# Patient Record
Sex: Female | Born: 1935 | Race: Black or African American | Hispanic: No | Smoking: Never smoker
Health system: Southern US, Community
[De-identification: ages and names within clinical notes are randomized; demographics above are authoritative.]

## PROBLEM LIST (undated history)

## (undated) DIAGNOSIS — F0280 Dementia in other diseases classified elsewhere without behavioral disturbance: Secondary | ICD-10-CM

## (undated) DIAGNOSIS — H269 Unspecified cataract: Secondary | ICD-10-CM

## (undated) DIAGNOSIS — I10 Essential (primary) hypertension: Secondary | ICD-10-CM

## (undated) DIAGNOSIS — M1990 Unspecified osteoarthritis, unspecified site: Secondary | ICD-10-CM

## (undated) DIAGNOSIS — H409 Unspecified glaucoma: Secondary | ICD-10-CM

## (undated) DIAGNOSIS — G3109 Other frontotemporal dementia: Secondary | ICD-10-CM

## (undated) HISTORY — DX: Essential (primary) hypertension: I10

## (undated) HISTORY — DX: Dementia in other diseases classified elsewhere, unspecified severity, without behavioral disturbance, psychotic disturbance, mood disturbance, and anxiety: F02.80

## (undated) HISTORY — PX: APPENDECTOMY: SHX54

## (undated) HISTORY — DX: Unspecified cataract: H26.9

## (undated) HISTORY — DX: Unspecified glaucoma: H40.9

## (undated) HISTORY — DX: Other frontotemporal dementia: G31.09

## (undated) HISTORY — DX: Unspecified osteoarthritis, unspecified site: M19.90

## (undated) HISTORY — PX: CATARACT EXTRACTION: SUR2

---

## 2005-07-01 ENCOUNTER — Ambulatory Visit: Payer: Self-pay | Admitting: Family Medicine

## 2008-02-07 ENCOUNTER — Other Ambulatory Visit: Payer: Self-pay

## 2008-02-08 ENCOUNTER — Other Ambulatory Visit: Payer: Self-pay

## 2008-02-08 ENCOUNTER — Inpatient Hospital Stay: Payer: Self-pay | Admitting: Internal Medicine

## 2008-02-10 ENCOUNTER — Inpatient Hospital Stay: Payer: Self-pay | Admitting: Internal Medicine

## 2008-03-05 ENCOUNTER — Ambulatory Visit: Payer: Self-pay | Admitting: Family Medicine

## 2008-06-17 ENCOUNTER — Ambulatory Visit: Payer: Self-pay | Admitting: Ophthalmology

## 2008-06-17 ENCOUNTER — Other Ambulatory Visit: Payer: Self-pay

## 2008-07-02 ENCOUNTER — Ambulatory Visit: Payer: Self-pay | Admitting: Ophthalmology

## 2009-03-31 ENCOUNTER — Ambulatory Visit: Payer: Self-pay | Admitting: Family Medicine

## 2009-04-21 ENCOUNTER — Encounter: Payer: Self-pay | Admitting: Internal Medicine

## 2009-04-25 ENCOUNTER — Encounter: Payer: Self-pay | Admitting: Internal Medicine

## 2009-04-26 ENCOUNTER — Emergency Department: Payer: Self-pay | Admitting: Internal Medicine

## 2009-04-28 ENCOUNTER — Emergency Department: Payer: Self-pay | Admitting: Emergency Medicine

## 2009-04-30 ENCOUNTER — Emergency Department: Payer: Self-pay | Admitting: Unknown Physician Specialty

## 2009-05-01 ENCOUNTER — Ambulatory Visit: Payer: Self-pay | Admitting: Vascular Surgery

## 2009-05-21 ENCOUNTER — Ambulatory Visit: Payer: Self-pay | Admitting: Family Medicine

## 2009-05-26 ENCOUNTER — Encounter: Payer: Self-pay | Admitting: Internal Medicine

## 2009-06-10 ENCOUNTER — Encounter: Payer: Self-pay | Admitting: Internal Medicine

## 2009-06-25 ENCOUNTER — Encounter: Payer: Self-pay | Admitting: Internal Medicine

## 2009-07-27 ENCOUNTER — Ambulatory Visit: Payer: Self-pay | Admitting: Internal Medicine

## 2009-08-05 ENCOUNTER — Ambulatory Visit: Payer: Self-pay | Admitting: Internal Medicine

## 2009-08-13 ENCOUNTER — Ambulatory Visit: Payer: Self-pay | Admitting: Internal Medicine

## 2009-08-18 ENCOUNTER — Ambulatory Visit: Payer: Self-pay | Admitting: Family Medicine

## 2009-08-20 ENCOUNTER — Ambulatory Visit: Payer: Self-pay | Admitting: Internal Medicine

## 2009-08-27 ENCOUNTER — Ambulatory Visit: Payer: Self-pay | Admitting: Internal Medicine

## 2009-09-10 ENCOUNTER — Ambulatory Visit: Payer: Self-pay | Admitting: Internal Medicine

## 2009-09-15 ENCOUNTER — Ambulatory Visit: Payer: Self-pay | Admitting: Internal Medicine

## 2009-09-30 ENCOUNTER — Ambulatory Visit: Payer: Self-pay | Admitting: Internal Medicine

## 2010-02-02 ENCOUNTER — Ambulatory Visit: Payer: Self-pay | Admitting: Family Medicine

## 2010-06-24 ENCOUNTER — Emergency Department: Payer: Self-pay | Admitting: Emergency Medicine

## 2011-07-13 ENCOUNTER — Ambulatory Visit: Payer: Self-pay | Admitting: Ophthalmology

## 2011-07-18 ENCOUNTER — Emergency Department: Payer: Self-pay | Admitting: *Deleted

## 2013-03-31 ENCOUNTER — Observation Stay: Payer: Self-pay | Admitting: Internal Medicine

## 2013-03-31 LAB — COMPREHENSIVE METABOLIC PANEL
Albumin: 3.6 g/dL (ref 3.4–5.0)
Alkaline Phosphatase: 108 U/L (ref 50–136)
Anion Gap: 5 — ABNORMAL LOW (ref 7–16)
BUN: 20 mg/dL — ABNORMAL HIGH (ref 7–18)
Calcium, Total: 8.8 mg/dL (ref 8.5–10.1)
Co2: 25 mmol/L (ref 21–32)
Creatinine: 0.69 mg/dL (ref 0.60–1.30)
EGFR (Non-African Amer.): >60
Glucose: 91 mg/dL (ref 65–99)
Osmolality: 283 (ref 275–301)
SGOT(AST): 29 U/L (ref 15–37)
Sodium: 141 mmol/L (ref 136–145)
Total Protein: 6.8 g/dL (ref 6.4–8.2)

## 2013-03-31 LAB — CBC
HCT: 41.9 % (ref 35.0–47.0)
HGB: 13.8 g/dL (ref 12.0–16.0)
MCH: 28.4 pg (ref 26.0–34.0)
MCV: 86 fL (ref 80–100)
Platelet: 185 10*3/uL (ref 150–440)
WBC: 7.8 10*3/uL (ref 3.6–11.0)

## 2013-03-31 LAB — URINALYSIS, COMPLETE
Bilirubin,UR: NEGATIVE
Leukocyte Esterase: NEGATIVE
Nitrite: NEGATIVE
Ph: 6 (ref 4.5–8.0)
Protein: NEGATIVE
RBC,UR: 1 /HPF (ref 0–5)
Specific Gravity: 1.018 (ref 1.003–1.030)
Squamous Epithelial: NONE SEEN
WBC UR: <1 /HPF (ref 0–5)

## 2013-03-31 LAB — TSH: Thyroid Stimulating Horm: 2.29 u[IU]/mL

## 2013-03-31 LAB — TROPONIN I: Troponin-I: 0.04 ng/mL

## 2013-04-01 LAB — BASIC METABOLIC PANEL
Anion Gap: 5 — ABNORMAL LOW (ref 7–16)
BUN: 16 mg/dL (ref 7–18)
Calcium, Total: 8.2 mg/dL — ABNORMAL LOW (ref 8.5–10.1)
Chloride: 111 mmol/L — ABNORMAL HIGH (ref 98–107)
Creatinine: 0.74 mg/dL (ref 0.60–1.30)
EGFR (African American): >60
Potassium: 3.4 mmol/L — ABNORMAL LOW (ref 3.5–5.1)
Sodium: 141 mmol/L (ref 136–145)

## 2013-04-02 ENCOUNTER — Encounter: Payer: Self-pay | Admitting: Internal Medicine

## 2013-04-04 LAB — TSH: Thyroid Stimulating Horm: 3.2 u[IU]/mL

## 2013-09-02 ENCOUNTER — Ambulatory Visit: Payer: Self-pay | Admitting: Family Medicine

## 2014-06-13 IMAGING — CR DG CHEST 2V
1 series · 2 of 2 positions shown · non-contrast
Comparison: none

REASON FOR EXAM: weakness
COMMENTS:

[Series 1: w chest pa · 0.14mm/px · 2 of 2 slices shown]
[im 1/2]
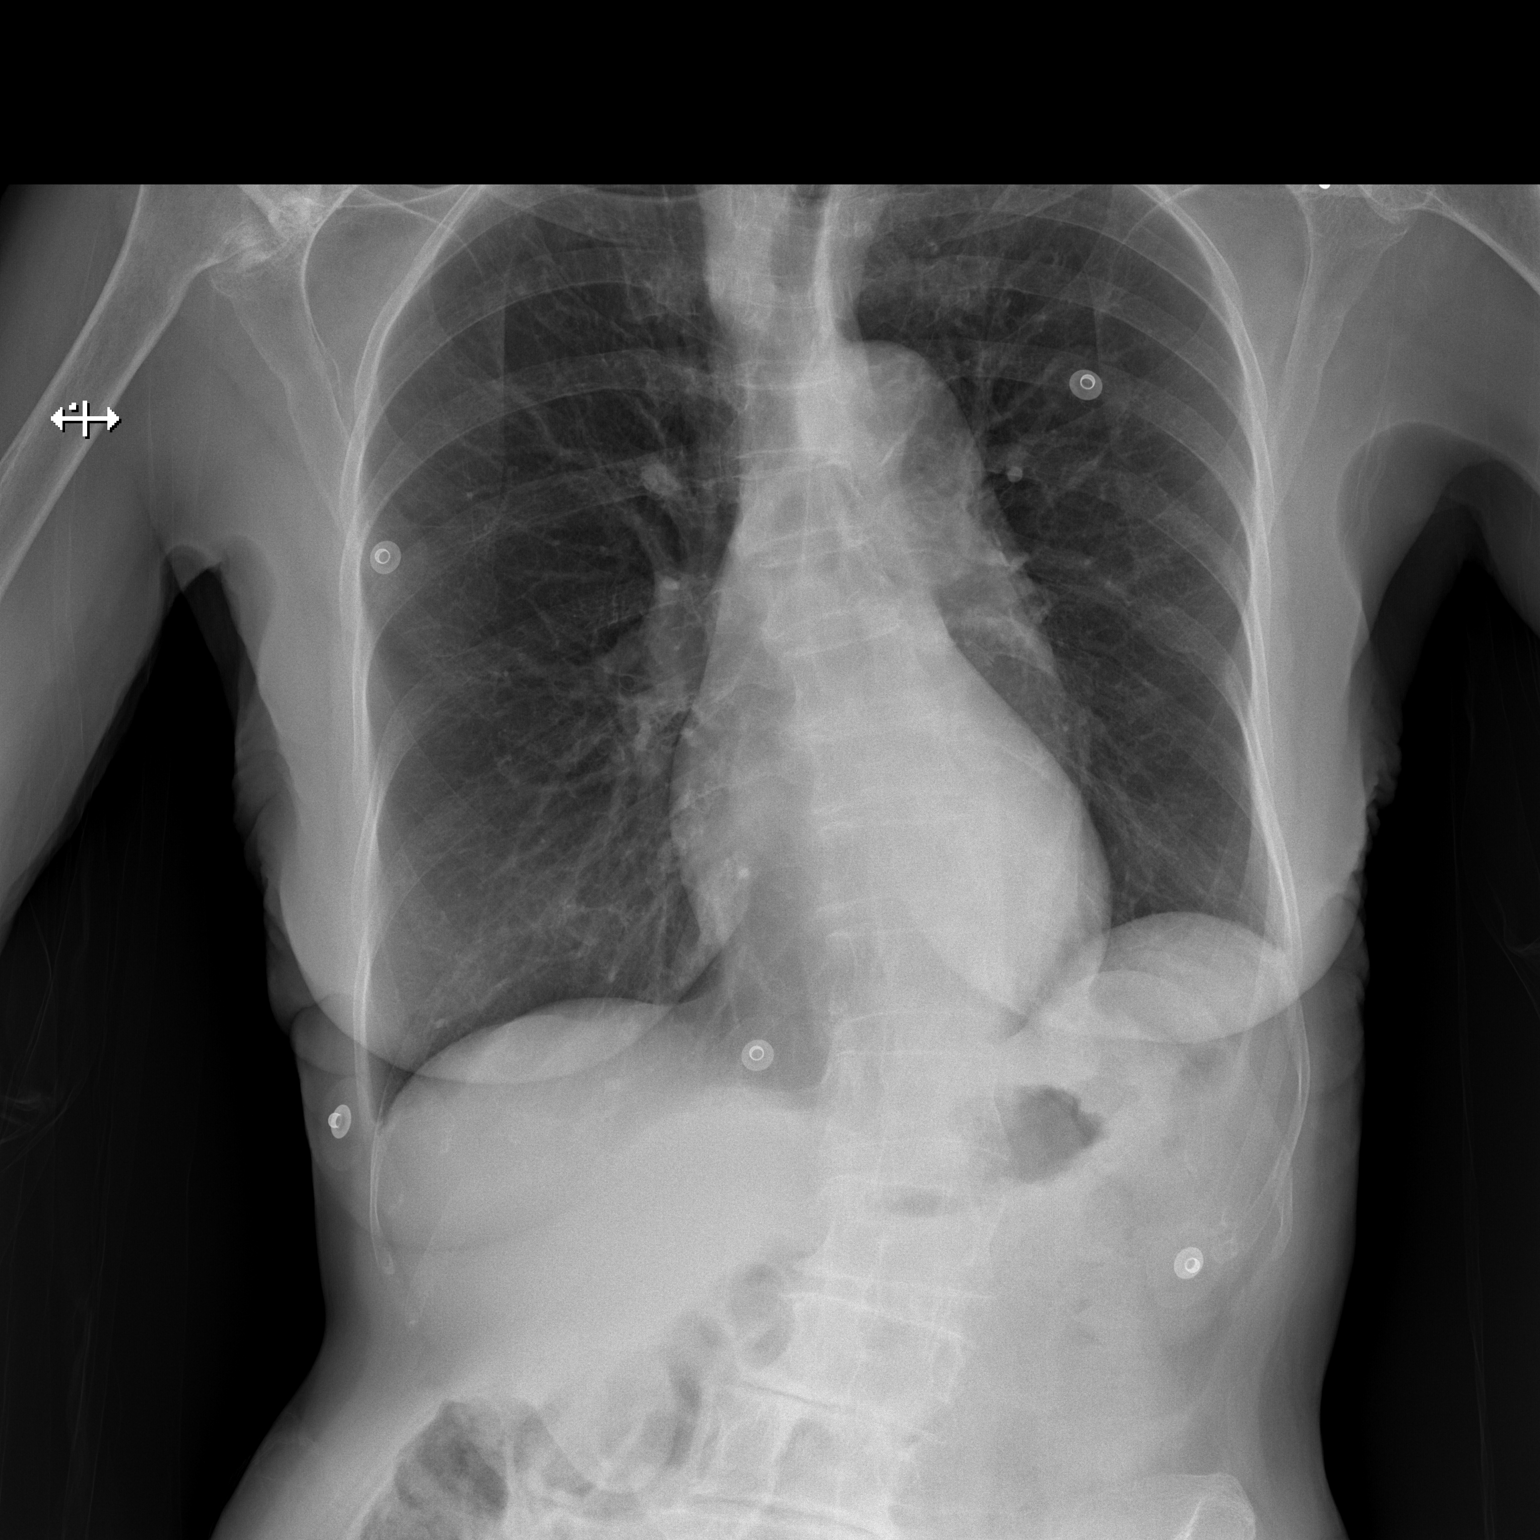
[im 2/2]
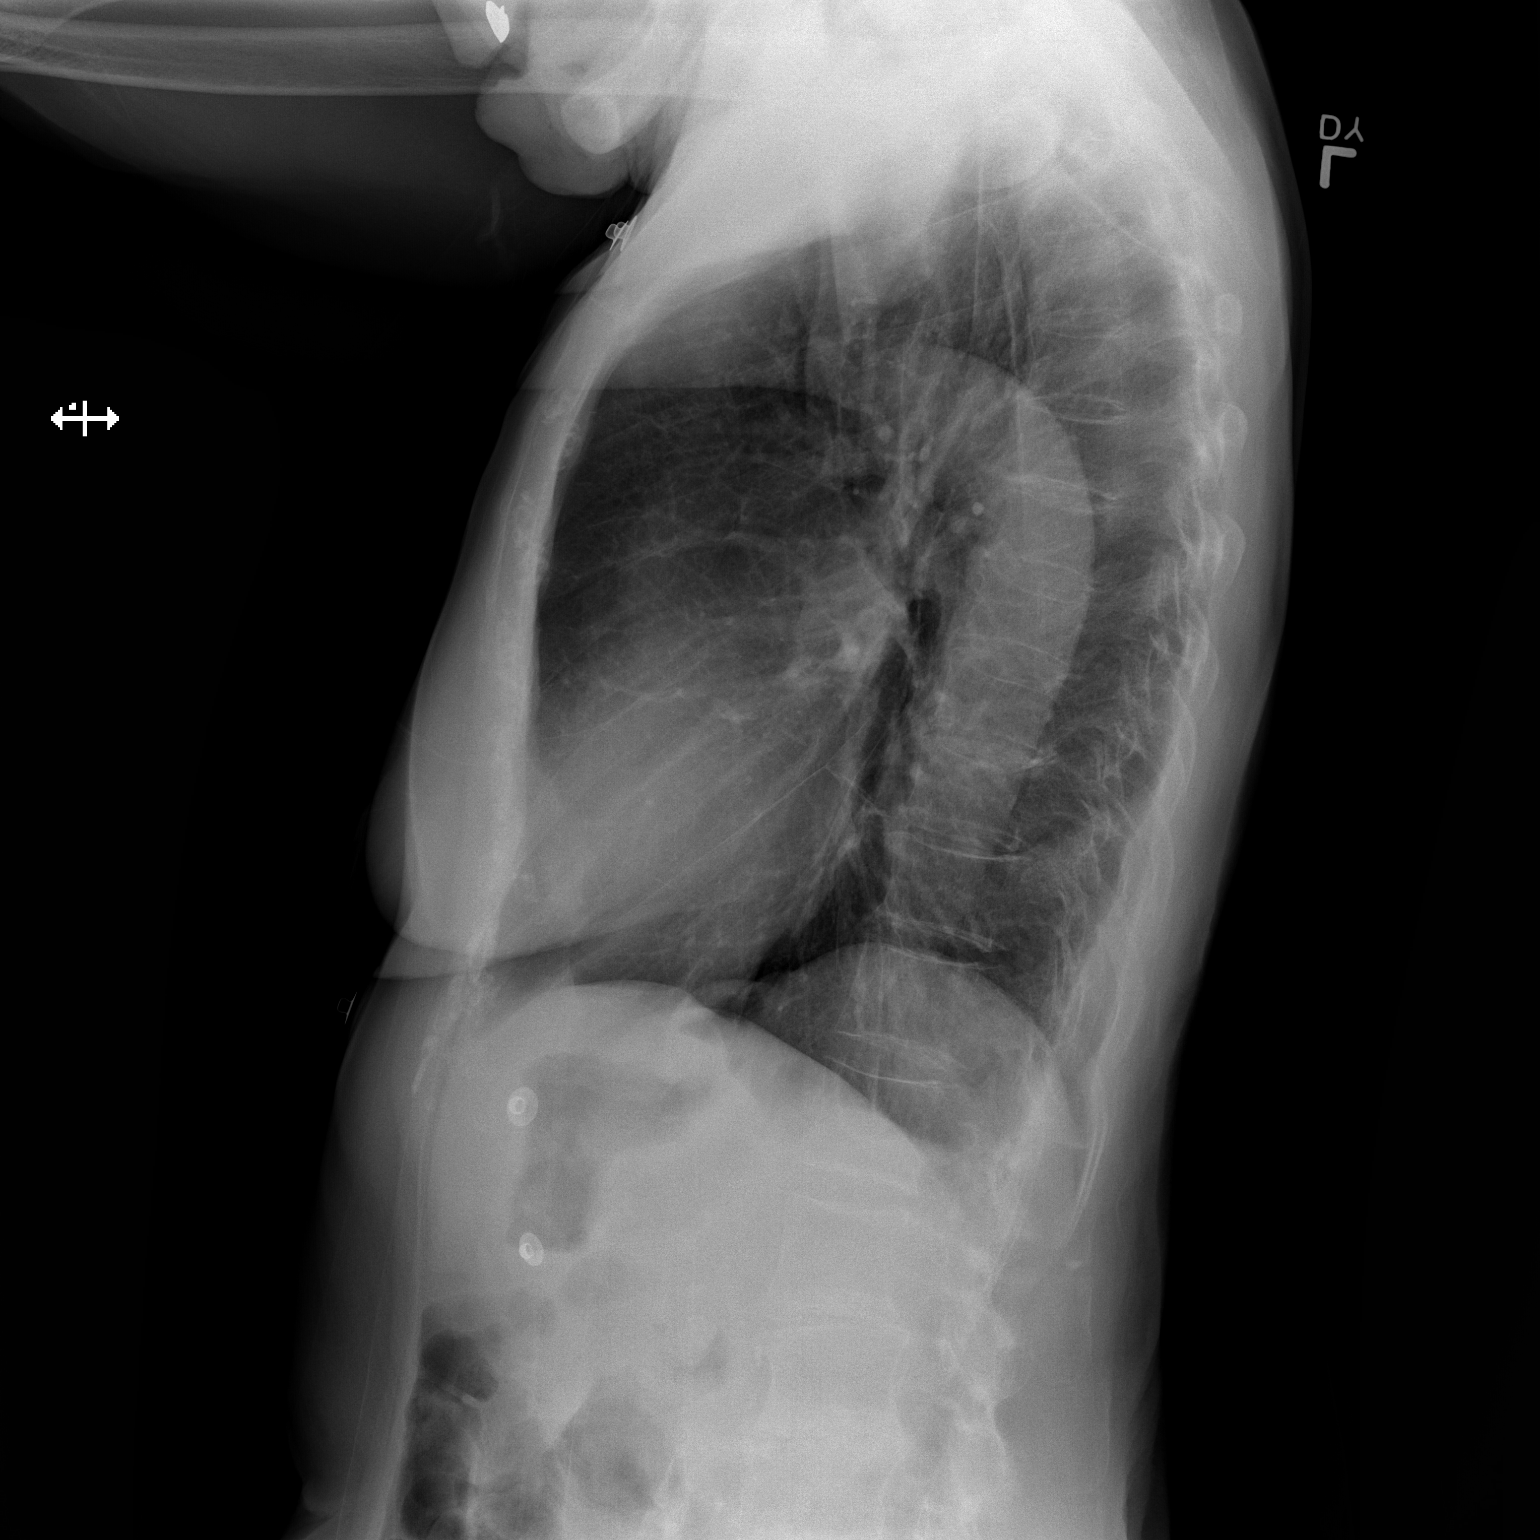

[2 of 2 positions shown; findings below may reference images not displayed]

PROCEDURE:     DXR - DXR CHEST PA (OR AP) AND LATERAL  - March 31, 2013  [DATE]

RESULT:     Comparison made to prior study of 07/18/2011. Lungs clear. Heart
size normal. Diffuse osteopenia and degenerative change thoracic spine with
stable mild compression fractures. Degenerative changes both shoulders.
IMPRESSION: No acute abnormality.

## 2015-04-17 DIAGNOSIS — H4011X3 Primary open-angle glaucoma, severe stage: Secondary | ICD-10-CM | POA: Diagnosis not present

## 2015-04-17 NOTE — Discharge Summary (Signed)
PATIENT NAME:  Monica Vaughn, [place] MR#:  [id number] DATE OF BIRTH:  [birth year]  DATE OF ADMISSION:  03/31/2013 DATE OF DISCHARGE:  04/082014  ADMISSION DIAGNOSIS: Generalized weakness.  DISCHARGE DIAGNOSES: 1.  Generalized weakness.  2.  Hypertension.  3.  History of glaucoma.  4.  Dementia.  5.  Superficial femoral vein deep venous thrombosis.   CONSULTANTS:  Physical therapy.   LABORATORY AND DIAGNOSTICS: At discharge: Sodium 141, potassium 3.4, chloride 111, bicarbonate 25, BUN 16, creatinine 0.74 and glucose 84.  Lower extremity Doppler showed cannot exclude a very mild nonocclusive thrombus in the right superficial femoral vein.   CT of the head showed no acute intracranial hemorrhage.  Chest x-ray showed no acute infiltrate.   HOSPITAL COURSE:  This is a very pleasant 81 year old female who presented with generalized weakness. For further details, please refer to the H and P. 1.  Weakness. The patient was admitted for generalized weakness. It was unclear as to the etiology. Her symptoms actually subsided. She has some mild weakness and PT recommended home health versus rehabilitation and the family wanted the patient to go to rehab so she will be discharged to rehab. 2.  Right lower extremity edema. Her Doppler showed possible nonocclusive DVT.  I discussed the risks, benefits and alternatives with the patient's family and the patient. We decided on Xarelto.  The patient was started on Xarelto 15 mg p.o. b.i.d. x 21 days and Xarelto 20 mg daily after that for 3 months.  2.  Hypertension. On Norvasc.  3.  Glaucoma. The patient will continue her eye drops.  DISCHARGE MEDICATIONS: 1.  Donepezil 10 mg daily.  2.  Combigan 1 drop b.i.d.  3.  Latanoprost 0.005% eyedrops to both eyes at bedtime.  4.  Azopt 1% ophthalmic suspension 1 drop b.i.d.  5.  Tylenol 2 tablets daily at bedtime for pain.  6.  Xarelto 15 mg b.i.d. for 15 days then 20 mg daily for 3 months.  7.  Norvasc 5 mg  daily.  8.  Nexium 40 mg daily.   DISCHARGE DIET: Low sodium. Consistency regular.   DISCHARGE ACTIVITY: As tolerated.  DISCHARGE INSTRUCTIONS:  The will need to discontinue Xarelto on 07/01/2013. She needs a hemoglobin check in 1 week.  TIME SPENT: 35 minutes.  ____________________________ Janyth ContesSital P. Juliene PinaMody, MD spm:sb D: 04/01/2013 15:21:32 ET T: 04/01/2013 15:38:34 ET JOB#: [id number]  cc: Britne Borelli P. Juliene PinaMody, MD, <Dictator> Dennison MascotLemont Morrisey, MD Janyth ContesSITAL P Alyxandra Tenbrink MD ELECTRONICALLY SIGNED 04/02/2013 14:35

## 2015-04-17 NOTE — H&P (Signed)
PATIENT NAME:  Monica Vaughn, Monica MR#:  [id number] DATE OF BIRTH:  May 06, 1917  DATE OF ADMISSION:  03/31/2013  PRIMARY CARE PHYSICIAN:  Dennison MascotLemont Morrisey, MD  CHIEF COMPLAINT: Weakness.   HISTORY OF PRESENT ILLNESS:  The patient is a 81 year old very pleasant female with a history of hypertension, mild dementia and glaucoma who presents with the above complaint. The patient has been in her usual state up until this morning when she tried to get out of bed. She felt very weak and rolled out of her bed and was unable to get up from the floor. She had no neurological deficits including slurred speech or weakness on one side. She was brought here for further evaluation. In the Emergency Room, a urinalysis was done which was negative as well as a CT of the head and chest x-ray. She has no neurological deficits. She does state that recently she has had suffered a cold.   REVIEW OF SYSTEMS:  CONSTITUTIONAL: No fever or fatigue. She has generalized weakness. No weight loss or gain.  EYES: No blurred or double vision. She has glaucoma.  ENT: No ear pain, hearing loss. She does have some seasonal allergies and snores occasionally.  RESPIRATORY: No cough, wheezing, or COPD, dyspnea.  CARDIOVASCULAR: No chest pain, palpitations, orthopnea, syncope, edema, arrhythmia.  GASTROINTESTINAL: No nausea, vomiting, diarrhea, abdominal pain, melena, or ulcers.  GENITOURINARY: No dysuria or hematuria.  ENDOCRINE: No polyuria, polydipsia, thyroid problems, heat or cold intolerance.  HEMATOLOGIC/LYMPHATICS: No anemia or easy bruising.  SKIN: No rash or lesions.  MUSCULOSKELETAL: No pain in the shoulder or knee. She sometimes has hip pain from arthritis. No limited activity, although she does walk occasionally with a cane.  NEUROLOGIC: No CVA, TIA or seizures.  PSYCHIATRIC: No anxiety or depression.   PAST MEDICAL HISTORY:   1.  Hypertension.  2.  Glaucoma.  3.  Dementia.   MEDICATIONS:  We do not have a correct  medication list at this time.   ALLERGIES:  ACE INHIBITOR CAUSES SWELLING.   PAST SURGICAL HISTORY:  Appendectomy.   SOCIAL HISTORY:  No tobacco, alcohol, or drug use. She lives by herself. Her niece checks on her and she does have someone to watch over her during the daytime.   PHYSICAL EXAMINATION:   VITAL SIGNS: Temperature 98.5, pulse 74, respirations 18, blood pressure 167/85, 96% on room air.  GENERAL: The patient is alert, oriented, not in acute distress.  HEENT: Head is atraumatic. Pupils are round and reactive. Sclerae anicteric. Mucous membranes are moist. Oropharynx is clear.  NECK: Supple without JVD, carotid bruit or enlarged thyroid.  CARDIOVASCULAR: Regular rate and rhythm. No murmurs, gallops, or rubs. PMI is not displaced.  LUNGS: Clear to auscultation bilaterally with crackles, rales, rhonchi, or wheezing. Normal percussion.  ABDOMEN: Bowel sounds are positive. Nontender, nondistended. No hepatosplenomegaly.  EXTREMITIES: No clubbing, or edema.  NEUROLOGIC: Cranial nerves II through 12 are intact. There are no focal deficits. Finger-to-nose is intact. Heel-to-shin is intact. Babinski sign is downgoing. Strength is 5+ bilaterally and symmetrically. She has very mild pitting edema bilaterally and symmetrically.  SKIN: Without rashes or lesions.   DIAGNOSTIC DATA:  EKG shows some Q waves in the anterior leads. No acute ST elevations or depressions. White blood cells 7.8, hemoglobin 13.8, hematocrit 42, platelets 185. Sodium 141, potassium 3.3, chloride 111, bicarbonate 25, BUN 20, creatinine 0.69, glucose 91, calcium 8.8, bilirubin 0.6, alkaline phosphatase 108, ALT 18, AST 29, total protein 6.8, albumin 3.6. Urinalysis shows no leukocyte esterase  or nitrites. Troponin is 0.04. CT of the head shows no acute intracranial hemorrhage or CVA. Chest x-ray shows no acute infiltrate.   ASSESSMENT AND PLAN:  A very pleasant 28 female with a history of dementia, hypertension and glaucoma  who presents with weakness.  1.  Generalized weakness. The patient has no neurological deficits to suggest a cerebrovascular accident. It is unclear as to the etiology of her generalized weakness. She has no sort of infection and a normal head CT. For now, we will place her on medical/surgical with telemetry and continue neuro checks q. 4 hours x 24 hours. I will also request physical therapy to assess the patient for gait.  2.  Hypokalemia, very mild. We will go head and replete and we can check potassium in the a.m.  3.  Hypertension. As per her old chart, she was on Norvasc. I have started Norvasc 10 mg and I have asked the nurse to obtain a medication list.  4.  Dementia. The patient was on Aricept in the past so we will continue this for now and obtain a new medication list.  5.  Lower extremity edema. The patient says that this is chronic and old, but will order Dopplers to rule out a deep vein thrombosis.   CODE STATUS:  The patient is FULL CODE STATUS.   TIME SPENT:  Approximately 45 minutes.    ____________________________ Janyth Contes. Juliene Pina, MD spm:si D: 03/31/2013 11:42:00 ET T: 03/31/2013 15:13:58 ET JOB#: [id number]  cc: Takera Rayl P. Juliene Pina, MD, <Dictator> Dennison Mascot, MD Janyth Contes Adelia Baptista MD ELECTRONICALLY SIGNED 03/31/2013 19:57

## 2015-04-17 NOTE — Discharge Summary (Signed)
PATIENT NAME:  [name], [place] MR#:  [id number] DATE OF BIRTH:  04/09/1917  ADDENDUM:  DATE OF ADMISSION:  03/31/2013 DATE OF DISCHARGE:  04/02/2013   The patient was supposed to be discharged on 04/01/2013, however, she actually was waiting for a rehab bed and she was discharged in 04/03/2003. There have been no other changes to her discharge summary.  Please refer to the discharge summary.    ____________________________ Janyth ContesSital P. Juliene PinaMody, MD spm:ct D: 04/02/2013 12:36:00 ET T: 04/02/2013 12:43:04 ET JOB#: [id number]  cc: Zylpha Poynor P. Juliene PinaMody, MD, <Dictator> Janyth ContesSITAL P Early Ord MD ELECTRONICALLY SIGNED 04/02/2013 14:36

## 2015-06-11 ENCOUNTER — Encounter: Payer: Self-pay | Admitting: Emergency Medicine

## 2015-06-15 ENCOUNTER — Ambulatory Visit: Payer: Self-pay | Admitting: Family Medicine

## 2015-07-14 ENCOUNTER — Ambulatory Visit (INDEPENDENT_AMBULATORY_CARE_PROVIDER_SITE_OTHER): Payer: Medicare Other | Admitting: Family Medicine

## 2015-07-14 ENCOUNTER — Encounter: Payer: Self-pay | Admitting: Family Medicine

## 2015-07-14 VITALS — BP 124/86 | HR 84 | Temp 97.6°F | Resp 16 | Ht 64.0 in | Wt 95.1 lb

## 2015-07-14 DIAGNOSIS — R413 Other amnesia: Secondary | ICD-10-CM

## 2015-07-14 DIAGNOSIS — E785 Hyperlipidemia, unspecified: Secondary | ICD-10-CM | POA: Diagnosis not present

## 2015-07-14 DIAGNOSIS — I7090 Unspecified atherosclerosis: Secondary | ICD-10-CM | POA: Insufficient documentation

## 2015-07-14 DIAGNOSIS — I739 Peripheral vascular disease, unspecified: Secondary | ICD-10-CM | POA: Diagnosis not present

## 2015-07-14 DIAGNOSIS — B351 Tinea unguium: Secondary | ICD-10-CM | POA: Insufficient documentation

## 2015-07-14 DIAGNOSIS — E46 Unspecified protein-calorie malnutrition: Secondary | ICD-10-CM

## 2015-07-14 DIAGNOSIS — I10 Essential (primary) hypertension: Secondary | ICD-10-CM | POA: Diagnosis not present

## 2015-07-14 DIAGNOSIS — R636 Underweight: Secondary | ICD-10-CM | POA: Diagnosis not present

## 2015-07-14 MED ORDER — ASPIRIN EC 81 MG PO TBEC: 81.0000 mg | DELAYED_RELEASE_TABLET | Freq: Every day | ORAL | Status: DC

## 2015-07-14 MED ORDER — FLUTICASONE PROPIONATE 50 MCG/ACT NA SUSP: 2.0000 | Freq: Every day | NASAL | Status: DC

## 2015-07-14 NOTE — Patient Instructions (Signed)
Refer to podiatrist 

## 2015-07-14 NOTE — Progress Notes (Signed)
Name: Monica Vaughn   MRN: [id number]    DOB: 1917/01/16   Date:07/14/2015       Progress Note  Subjective  Chief Complaint  Chief Complaint  Patient presents with  . Memory Loss  . Hyperlipidemia  . Hypertension    Hyperlipidemia This is a chronic problem. The current episode started more than 1 year ago. The problem is uncontrolled. Recent lipid tests were reviewed and are high. Pertinent negatives include no chest pain, focal weakness, myalgias or shortness of breath. Current antihyperlipidemic treatment includes statins. The current treatment provides mild improvement of lipids. Compliance problems include medication cost.  Risk factors for coronary artery disease include dyslipidemia, hypertension, post-menopausal and a sedentary lifestyle.  Hypertension This is a chronic problem. The current episode started more than 1 year ago. The problem is unchanged. The problem is controlled. Associated symptoms include malaise/fatigue. Pertinent negatives include no blurred vision, chest pain, headaches, neck pain, orthopnea, palpitations or shortness of breath. There are no associated agents to hypertension. Past treatments include calcium channel blockers. The current treatment provides moderate improvement. Compliance problems include diet.    Dementia.  Patient has a long-standing history of worsening dementia. She is currently on Aricept 10 mg daily. She is not having behavioral issues at this time. Next problem weight loss subjective patient has continued to suffer weight loss. She's lost from 106-95 over the past 2 months or so. Her dietary and is intake has been reduced she has not been taking the ensure shakes this is suggestive past  Protein calorie malnutrition  Patient continues to have a low appetite and has continued to lose weight. In fact she's lost about 11 pounds in the last 1-1/2 months. She has trouble taking boost and ensure that her daughter was able to find another  nutritional supplement which she tolerated better. Has been no nausea vomiting melena hematochezia.  Onychomycosis  Complaint of thickening and crumbling of both the fingernails and toenails.    Past Medical History  Diagnosis Date  . Cataract   . Glaucoma   . Hypertension   . Osteoarthrosis   . Frontotemporal dementia     History  Substance Use Topics  . Smoking status: Never Smoker   . Smokeless tobacco: Not on file  . Alcohol Use: No     Current outpatient prescriptions:  .  amLODipine (NORVASC) 5 MG tablet, , Disp: , Rfl:  .  atorvastatin (LIPITOR) 40 MG tablet, , Disp: , Rfl:  .  AZOPT 1 % ophthalmic suspension, , Disp: , Rfl:  .  COMBIGAN 0.2-0.5 % ophthalmic solution, , Disp: , Rfl:  .  donepezil (ARICEPT) 10 MG tablet, , Disp: , Rfl:  .  latanoprost (XALATAN) 0.005 % ophthalmic solution, , Disp: , Rfl:   Allergies  Allergen Reactions  . Ace Inhibitors   . Telmisartan     Review of Systems  Constitutional: Positive for weight loss and malaise/fatigue. Negative for fever and chills.  HENT: Negative for congestion, hearing loss, sore throat and tinnitus.   Eyes: Negative for blurred vision, double vision and redness.  Respiratory: Negative for cough, hemoptysis and shortness of breath.   Cardiovascular: Negative for chest pain, palpitations, orthopnea, claudication and leg swelling.  Gastrointestinal: Negative for heartburn, nausea, vomiting, diarrhea, constipation and blood in stool.  Genitourinary: Negative for dysuria, urgency, frequency and hematuria.  Musculoskeletal: Positive for joint pain. Negative for myalgias, back pain, falls and neck pain.  Skin: Negative for itching.       Onychomycosis  of both hands and feet  Neurological: Negative for dizziness, tingling, tremors, focal weakness, seizures, loss of consciousness, weakness and headaches.  Endo/Heme/Allergies: Does not bruise/bleed easily.  Psychiatric/Behavioral: Positive for memory loss. Negative  for depression and substance abuse. The patient is not nervous/anxious and does not have insomnia.      Objective  Filed Vitals:   07/14/15 1058  BP: 124/86  Pulse: 84  Temp: 97.6 F (36.4 C)  Resp: 16  Height:  (1.626 m)  Weight: 95 lb 1 oz (43.12 kg)  SpO2: 99%     Physical Exam  Constitutional:  Thin and cachexic  HENT:  Head: Normocephalic.  Eyes: EOM are normal. Pupils are equal, round, and reactive to light.  Neck: Normal range of motion. No thyromegaly present.  Cardiovascular: Normal rate and regular rhythm.   Murmur heard. Grade 2-3 mitral regurg murmur  Pulmonary/Chest: Effort normal and breath sounds normal.  Abdominal: Soft. Bowel sounds are normal.  Musculoskeletal: Normal range of motion. She exhibits tenderness. She exhibits no edema.  Neurological: She is alert. No cranial nerve deficit. Gait normal.  Oriented to person only  Skin: Skin is warm and dry. No rash noted.  Onychomycosis of both the hands and feet  Psychiatric: Memory and affect normal.      Assessment & Plan  1. Essential hypertension Well-controlled - Comprehensive metabolic panel  2. Hyperlipidemia Labs today - TSH - Direct LDL  3. Protein calorie malnutrition Encourage nutritional supplements  4. Underweight due to inadequate caloric intake Check absolute lymphocyte count - CBC  5. Memory loss Continue anti-dementia medications and family support  6. Nail fungus Consider Lamisil but she apparently did not tolerate this well before - Ambulatory referral to Podiatry

## 2015-07-15 LAB — COMPREHENSIVE METABOLIC PANEL
ALBUMIN: 4.1 g/dL (ref 3.2–4.6)
ALK PHOS: 79 IU/L (ref 39–117)
ALT: 46 IU/L — AB (ref 0–32)
AST: 51 IU/L — ABNORMAL HIGH (ref 0–40)
Albumin/Globulin Ratio: 1.6 (ref 1.1–2.5)
BUN/Creatinine Ratio: 17 (ref 11–26)
BUN: 13 mg/dL (ref 10–36)
Bilirubin Total: 0.7 mg/dL (ref 0.0–1.2)
CHLORIDE: 104 mmol/L (ref 97–108)
CO2: 24 mmol/L (ref 18–29)
CREATININE: 0.76 mg/dL (ref 0.57–1.00)
Calcium: 9.5 mg/dL (ref 8.7–10.3)
GFR calc Af Amer: 77 mL/min/{1.73_m2} (ref >59)
GFR, EST NON AFRICAN AMERICAN: 66 mL/min/{1.73_m2} (ref >59)
GLOBULIN, TOTAL: 2.5 g/dL (ref 1.5–4.5)
GLUCOSE: 81 mg/dL (ref 65–99)
Potassium: 4 mmol/L (ref 3.5–5.2)
SODIUM: 143 mmol/L (ref 134–144)
TOTAL PROTEIN: 6.6 g/dL (ref 6.0–8.5)

## 2015-07-15 LAB — LDL CHOLESTEROL, DIRECT: LDL Direct: 54 mg/dL (ref 0–99)

## 2015-07-15 LAB — CBC
Hematocrit: 42.2 % (ref 34.0–46.6)
Hemoglobin: 13.1 g/dL (ref 11.1–15.9)
MCH: 27.7 pg (ref 26.6–33.0)
MCHC: 31 g/dL — ABNORMAL LOW (ref 31.5–35.7)
MCV: 89 fL (ref 79–97)
Platelets: 222 10*3/uL (ref 150–379)
RBC: 4.73 x10E6/uL (ref 3.77–5.28)
RDW: 14.9 % (ref 12.3–15.4)
WBC: 5 10*3/uL (ref 3.4–10.8)

## 2015-07-15 LAB — TSH: TSH: 1.07 u[IU]/mL (ref 0.450–4.500)

## 2015-07-16 ENCOUNTER — Telehealth: Payer: Self-pay | Admitting: Emergency Medicine

## 2015-07-16 NOTE — Telephone Encounter (Signed)
Spoke to patients daughter, Notified of stable labs

## 2015-08-13 ENCOUNTER — Ambulatory Visit (INDEPENDENT_AMBULATORY_CARE_PROVIDER_SITE_OTHER): Payer: Medicare Other | Admitting: Podiatry

## 2015-08-13 VITALS — BP 149/83 | HR 62 | Resp 16

## 2015-08-13 DIAGNOSIS — B351 Tinea unguium: Secondary | ICD-10-CM

## 2015-08-13 DIAGNOSIS — M79676 Pain in unspecified toe(s): Secondary | ICD-10-CM | POA: Diagnosis not present

## 2015-08-13 NOTE — Progress Notes (Signed)
   Subjective:    Patient ID: Monica Vaughn, female    DOB: 09-Oct-1917, 81 y.o.   MRN: [id number]  HPI Subjective: 81 y.o. returns the office today for painful, elongated, thickened toenails which she is unable to trim herself. Denies any redness or drainage around the nails. Denies any acute changes since last appointment and no new complaints today. Denies any systemic complaints such as fevers, chills, nausea, vomiting.    Review of Systems  HENT:       Glaucoma  Sinus problems   Cardiovascular:       Poor circulation   Gastrointestinal:       IBS  Skin:       Change in hair or nails   Hematological:       Blood clotting disorder Slow to heal   All other systems reviewed and are negative.      Objective:   Physical Exam AAO 3, NAD; presents with her daughter  DP/PT pulses palpable, CRT less than 3 seconds Protective sensation appears intact with Dorann Ou monofilament Nails hypertrophic, dystrophic, elongated, brittle, discolored 10. There is tenderness overlying the nails 1-5 bilaterally. There is no surrounding erythema or drainage along the nail sites. No open lesions or pre-ulcerative lesions are identified. No other areas of tenderness bilateral lower extremities. No overlying edema, erythema, increased warmth. No pain with calf compression, swelling, warmth, erythema.     Assessment & Plan:  81 year old female with symptomatic onychomycosis -Treatment options including alternatives, risks, complications were discussed -Nails sharply debrided 10 without complication/bleeding. -Discussed treatment options for nail fungus- discussed fungi-nail.  -Discussed daily foot inspection. If there are any changes, to call the office immediately.  -Follow-up in 3 months or sooner if any problems are to arise. In the meantime, encouraged to call the office with any questions, concerns, changes symptoms. Follow up with PCP for other issues in the ROS.   Ovid Curd, DPM

## 2015-10-15 ENCOUNTER — Other Ambulatory Visit: Payer: Self-pay | Admitting: Family Medicine

## 2015-10-15 DIAGNOSIS — H401133 Primary open-angle glaucoma, bilateral, severe stage: Secondary | ICD-10-CM | POA: Diagnosis not present

## 2015-11-12 ENCOUNTER — Ambulatory Visit (INDEPENDENT_AMBULATORY_CARE_PROVIDER_SITE_OTHER): Payer: Medicare Other | Admitting: Podiatry

## 2015-11-12 DIAGNOSIS — B351 Tinea unguium: Secondary | ICD-10-CM

## 2015-11-12 DIAGNOSIS — M79676 Pain in unspecified toe(s): Secondary | ICD-10-CM | POA: Diagnosis not present

## 2015-11-12 DIAGNOSIS — I739 Peripheral vascular disease, unspecified: Secondary | ICD-10-CM

## 2015-11-12 NOTE — Patient Instructions (Signed)
While in [place] you can call [facility] if you need foot care while living in [place]. [facility] - [phone] [address], [place], [place] [address] [facility] - [phone] [address] [place], [place], [place] [address]  [facility] - [phone]  [address] [place], [place], [place] [address]   [facility] - [phone] [address], [place], [place] [address]  [facility] - [phone] [address], [address], [place], [place] [address]

## 2015-11-12 NOTE — Progress Notes (Signed)
   Subjective: 81 y.o. returns the office today for painful, elongated, thickened toenails which she cannot trim herself. Denies any redness or drainage around the nails. Denies any acute changes since last appointment and no new complaints today. Denies any systemic complaints such as fevers, chills, nausea, vomiting.   Objective: AAO 3, NAD DP/PT pulses palpable 1/4, CRT less than 3 seconds Nails hypertrophic, dystrophic, elongated, brittle, discolored 10. There is tenderness overlying the nails 1-5 bilaterally. There is no surrounding erythema or drainage along the nail sites. No open lesions or pre-ulcerative lesions are identified. No other areas of tenderness bilateral lower extremities. No overlying edema, erythema, increased warmth. No pain with calf compression, swelling, warmth, erythema.  Assessment: Patient presents with symptomatic onychomycosis  Plan: -Treatment options including alternatives, risks, complications were discussed -Nails sharply debrided 10 without complication/bleeding. -Discussed daily foot inspection. If there are any changes, to call the office immediately.  -Follow-up in 3 months or sooner if any problems are to arise. In the meantime, encouraged to call the office with any questions, concerns, changes symptoms.  Ovid CurdMatthew Dajha Urquilla, DPM

## 2015-11-16 ENCOUNTER — Ambulatory Visit (INDEPENDENT_AMBULATORY_CARE_PROVIDER_SITE_OTHER): Payer: Medicare Other | Admitting: Family Medicine

## 2015-11-16 ENCOUNTER — Encounter: Payer: Self-pay | Admitting: Family Medicine

## 2015-11-16 VITALS — BP 142/81 | HR 75 | Temp 98.3°F | Resp 14 | Ht 64.0 in | Wt 103.8 lb

## 2015-11-16 DIAGNOSIS — R413 Other amnesia: Secondary | ICD-10-CM

## 2015-11-16 DIAGNOSIS — E785 Hyperlipidemia, unspecified: Secondary | ICD-10-CM | POA: Diagnosis not present

## 2015-11-16 DIAGNOSIS — I739 Peripheral vascular disease, unspecified: Secondary | ICD-10-CM | POA: Diagnosis not present

## 2015-11-16 DIAGNOSIS — Z23 Encounter for immunization: Secondary | ICD-10-CM

## 2015-11-16 DIAGNOSIS — I10 Essential (primary) hypertension: Secondary | ICD-10-CM

## 2015-11-16 MED ORDER — LORAZEPAM 0.5 MG PO TABS
0.5000 mg | ORAL_TABLET | Freq: Two times a day (BID) | ORAL | Status: DC | PRN
Start: 2015-11-16 — End: 2018-07-25

## 2015-11-16 MED ORDER — DICLOFENAC SODIUM 1 % TD GEL: 2.0000 g | Freq: Four times a day (QID) | TRANSDERMAL | Status: DC

## 2015-11-16 MED ORDER — OMEPRAZOLE 20 MG PO CPDR: 20.0000 mg | DELAYED_RELEASE_CAPSULE | Freq: Every day | ORAL | Status: DC

## 2015-11-16 NOTE — Progress Notes (Signed)
Name: Monica Vaughn   MRN: [id number]    DOB: 10-06-1917   Date:11/16/2015       Progress Note  Subjective  Chief Complaint  Chief Complaint  Patient presents with  . Medication Refill    follow-up  . Hypertension  . Hyperlipidemia  . Dementia  . Arthritis    pain on left side of body tylenol otc is not helping  . Nail Problem    fungus on fingernails needs referral to dermatologist    HPI  Hypertension  Of any serious behavioral issues.  Patient presents for follow-up of hypertension. It has been present for over 5 years.  Patient states that there is compliance with medical regimen which consists of amlodipine 5 mg daily. There is no end organ disease. Cardiac risk factors include hypertension hyperlipidemia and diabetes.  Exercise regimen consist of none .  Diet consist of some sodium restriction .  Hyperlipidemia  Patient has a history of hyperlipidemia for over 5 years.  Current medical regimen consist of atorvastatin 40 mg daily at bedtime .  Compliance is good with the family's help .  Diet and exercise are currently followed apparently well .  Risk factors for cardiovascular disease include hyperlipidemia hypertension and advanced age and peripheral vascular disease .   There have been no side effects from the medication.    Dementia history of present illness  Patient has had a several year history of dementia. It has basically been stable but at times has waxed and waned in severity. She is currently on Aricept 10 mg daily and is in the care guardianship of her children. She continues to have some memory deficit but has not been violent or had any hallucinations  Past Medical History  Diagnosis Date  . Cataract   . Glaucoma   . Hypertension   . Osteoarthrosis   . Frontotemporal dementia     Social History  Substance Use Topics  . Smoking status: Never Smoker   . Smokeless tobacco: Not on file  . Alcohol Use: No     Current outpatient prescriptions:   .  amLODipine (NORVASC) 5 MG tablet, 1 Tablet, Oral, Every Day, Disp: 60 tablet, Rfl: 0 .  aspirin EC 81 MG tablet, Take 1 tablet (81 mg total) by mouth daily., Disp: 30 tablet, Rfl: 2 .  atorvastatin (LIPITOR) 40 MG tablet, , Disp: , Rfl:  .  AZOPT 1 % ophthalmic suspension, , Disp: , Rfl:  .  COMBIGAN 0.2-0.5 % ophthalmic solution, , Disp: , Rfl:  .  donepezil (ARICEPT) 10 MG tablet, , Disp: , Rfl:  .  fluticasone (FLONASE) 50 MCG/ACT nasal spray, Place 2 sprays into both nostrils daily., Disp: 16 g, Rfl: 6 .  latanoprost (XALATAN) 0.005 % ophthalmic solution, , Disp: , Rfl:   Allergies  Allergen Reactions  . Ace Inhibitors   . Telmisartan     Review of Systems  Constitutional: Negative for fever, chills and weight loss.       Frail elderly and pleasantly demented  HENT: Negative for congestion, hearing loss, sore throat and tinnitus.   Eyes: Negative for blurred vision, double vision and redness.  Respiratory: Negative for cough, hemoptysis and shortness of breath.   Cardiovascular: Positive for claudication. Negative for chest pain, palpitations, orthopnea and leg swelling.  Gastrointestinal: Negative for heartburn, nausea, vomiting, diarrhea, constipation and blood in stool.  Genitourinary: Negative for dysuria, urgency, frequency and hematuria.  Musculoskeletal: Positive for joint pain. Negative for myalgias, back pain, falls and  neck pain.  Skin: Negative for itching.  Neurological: Negative for dizziness, tingling, tremors, focal weakness, seizures, loss of consciousness, weakness and headaches.  Endo/Heme/Allergies: Does not bruise/bleed easily.  Psychiatric/Behavioral: Positive for memory loss. Negative for depression and substance abuse. The patient is not nervous/anxious and does not have insomnia.      Objective  Filed Vitals:   11/16/15 1121  BP: 142/81  Pulse: 75  Temp: 98.3 F (36.8 C)  TempSrc: Oral  Resp: 14  Height:  (1.626 m)  Weight: 103 lb 12.8  oz (47.083 kg)  SpO2: 97%     Physical Exam  Constitutional: She is well-developed, well-nourished, and in no distress.  Frail elderly and  pleasantly demented  HENT:  Head: Normocephalic.  Eyes: EOM are normal. Pupils are equal, round, and reactive to light.  Neck: Normal range of motion. No thyromegaly present.  Cardiovascular: Normal rate, regular rhythm and normal heart sounds.   No murmur heard. Distal pulses markedly diminished cool lower extremities with decreased capillary refill  Pulmonary/Chest: Effort normal and breath sounds normal.  Abdominal: Soft. Bowel sounds are normal.  Musculoskeletal: She exhibits tenderness. She exhibits no edema.  Generalized arthritic changes with some possibility of movement  Neurological: She is alert. No cranial nerve deficit.  Oriented to person place  Skin: Skin is warm and dry. No rash noted.  Psychiatric:  Diminished memory for recent events and she is rather loquacious and appropriate times      Assessment & Plan   1. Essential hypertension Near goal and probably best tolerable given her tendency to orthostasis and her age  53. Needs flu shot Given - Flu vaccine HIGH DOSE PF (Fluzone High dose)  3. PAD (peripheral artery disease) (HCC) Stable  4. Hyperlipidemia Continue atorvastatin  5. Memory loss Continue Aricept  6. Need for influenza vaccination Given

## 2015-11-17 ENCOUNTER — Telehealth: Payer: Self-pay | Admitting: Family Medicine

## 2015-11-17 NOTE — Telephone Encounter (Signed)
Kathrin RuddyGloria Hill daughter of pt would like a call back about a prescription.

## 2015-12-14 ENCOUNTER — Other Ambulatory Visit: Payer: Self-pay | Admitting: Family Medicine

## 2016-01-11 ENCOUNTER — Other Ambulatory Visit: Payer: Self-pay | Admitting: Family Medicine

## 2016-03-14 ENCOUNTER — Other Ambulatory Visit: Payer: Self-pay | Admitting: Family Medicine

## 2016-03-18 ENCOUNTER — Ambulatory Visit: Payer: Self-pay | Admitting: Family Medicine

## 2016-03-22 ENCOUNTER — Other Ambulatory Visit: Payer: Self-pay | Admitting: Family Medicine

## 2016-04-16 ENCOUNTER — Other Ambulatory Visit: Payer: Self-pay | Admitting: Family Medicine

## 2016-04-23 ENCOUNTER — Other Ambulatory Visit: Payer: Self-pay | Admitting: Family Medicine

## 2016-04-29 DIAGNOSIS — H401133 Primary open-angle glaucoma, bilateral, severe stage: Secondary | ICD-10-CM | POA: Diagnosis not present

## 2016-05-12 ENCOUNTER — Other Ambulatory Visit: Payer: Self-pay | Admitting: Family Medicine

## 2016-05-16 ENCOUNTER — Telehealth: Payer: Self-pay | Admitting: Emergency Medicine

## 2016-05-16 ENCOUNTER — Telehealth: Payer: Self-pay | Admitting: Family Medicine

## 2016-05-16 NOTE — Telephone Encounter (Signed)
Can discuss Atorvastatin refills at appointment. Last refill 2016

## 2016-05-16 NOTE — Telephone Encounter (Signed)
Donazepril sent to pharmacy

## 2016-05-16 NOTE — Telephone Encounter (Signed)
Patient only have 1 pill left of the Atorvastatin with no refills and is also needing refill on Donepezil. Please send to [facility]. Have appointment with Dr. Sherryll BurgerShah for 06-14-16

## 2016-05-17 ENCOUNTER — Other Ambulatory Visit: Payer: Self-pay | Admitting: Family Medicine

## 2016-06-14 ENCOUNTER — Ambulatory Visit (INDEPENDENT_AMBULATORY_CARE_PROVIDER_SITE_OTHER): Payer: Medicare Other | Admitting: Family Medicine

## 2016-06-14 ENCOUNTER — Encounter: Payer: Self-pay | Admitting: Family Medicine

## 2016-06-14 VITALS — BP 118/64 | HR 66 | Temp 98.1°F | Resp 16 | Ht 64.0 in | Wt 98.4 lb

## 2016-06-14 DIAGNOSIS — J310 Chronic rhinitis: Secondary | ICD-10-CM | POA: Insufficient documentation

## 2016-06-14 DIAGNOSIS — I10 Essential (primary) hypertension: Secondary | ICD-10-CM

## 2016-06-14 DIAGNOSIS — E785 Hyperlipidemia, unspecified: Secondary | ICD-10-CM | POA: Diagnosis not present

## 2016-06-14 DIAGNOSIS — R413 Other amnesia: Secondary | ICD-10-CM | POA: Diagnosis not present

## 2016-06-14 DIAGNOSIS — R197 Diarrhea, unspecified: Secondary | ICD-10-CM | POA: Insufficient documentation

## 2016-06-14 DIAGNOSIS — K529 Noninfective gastroenteritis and colitis, unspecified: Secondary | ICD-10-CM | POA: Diagnosis not present

## 2016-06-14 DIAGNOSIS — J309 Allergic rhinitis, unspecified: Secondary | ICD-10-CM

## 2016-06-14 MED ORDER — DONEPEZIL HCL 10 MG PO TABS: 10.0000 mg | ORAL_TABLET | Freq: Every day | ORAL | Status: DC

## 2016-06-14 MED ORDER — ATORVASTATIN CALCIUM 40 MG PO TABS: 40.0000 mg | ORAL_TABLET | Freq: Every day | ORAL | Status: DC

## 2016-06-14 MED ORDER — AMLODIPINE BESYLATE 5 MG PO TABS: 5.0000 mg | ORAL_TABLET | Freq: Every day | ORAL | Status: DC

## 2016-06-14 MED ORDER — MOMETASONE FUROATE 50 MCG/ACT NA SUSP: 2.0000 | Freq: Every day | NASAL | Status: DC

## 2016-06-14 NOTE — Progress Notes (Signed)
Name: Monica Vaughn   MRN: [id number]    DOB: 1917-06-23   Date:06/14/2016       Progress Note  Subjective  Chief Complaint  Chief Complaint  Patient presents with  . Hypertension    follow up  . Memory Loss  . Hyperlipidemia  This patient is usually followed by Dr. Thana AtesMorrisey, she is new to me  Hypertension This is a chronic problem. The problem is controlled. Pertinent negatives include no blurred vision, chest pain, headaches, palpitations or shortness of breath. Past treatments include calcium channel blockers. There is no history of kidney disease, CAD/MI or CVA.  Hyperlipidemia This is a chronic problem. Condition status: Last FLP not avialable in EPIC. Pertinent negatives include no chest pain, leg pain, myalgias or shortness of breath. Current antihyperlipidemic treatment includes statins.  Diarrhea  This is a recurrent problem. The current episode started more than 1 year ago. The stool consistency is described as watery. Pertinent negatives include no bloating, chills, headaches, increased  flatus, myalgias or vomiting. Associated symptoms comments: Has constipation which sometimes alternates with diarrhea.. There are no known risk factors. She has tried anti-motility drug for the symptoms.   Dementia: Pt. Has been diagnosed with Dementia, daughter who does not usually live with her is here with the patient during the office visit and most of the HPI is obtained from her daughter.  Briefly, patient has episodes of forgetfulness, mainly recent past while remote past memories are generally intact. No behavioral disturbance recently but sometimes gets agitated, especially when she cannot get what she wants.  She is on Aricept 10 mg daily.      Past Medical History  Diagnosis Date  . Cataract   . Glaucoma   . Hypertension   . Osteoarthrosis   . Frontotemporal dementia     Past Surgical History  Procedure Laterality Date  . Cataract extraction    . Appendectomy       History reviewed. No pertinent family history.  Social History   Social History  . Marital Status: Married    Spouse Name: N/A  . Number of Children: N/A  . Years of Education: N/A   Occupational History  . Not on file.   Social History Main Topics  . Smoking status: Never Smoker   . Smokeless tobacco: Not on file  . Alcohol Use: No  . Drug Use: No  . Sexual Activity: Not on file   Other Topics Concern  . Not on file   Social History Narrative     Current outpatient prescriptions:  .  amLODipine (NORVASC) 5 MG tablet, TAKE ONE TABLET BY MOUTH ONCE DAILY, Disp: 60 tablet, Rfl: 0 .  aspirin EC 81 MG tablet, Take 1 tablet (81 mg total) by mouth daily., Disp: 30 tablet, Rfl: 2 .  atorvastatin (LIPITOR) 40 MG tablet, TAKE ONE TABLET BY MOUTH ONCE DAILY, Disp: 30 tablet, Rfl: 0 .  AZOPT 1 % ophthalmic suspension, , Disp: , Rfl:  .  COMBIGAN 0.2-0.5 % ophthalmic solution, , Disp: , Rfl:  .  diclofenac sodium (VOLTAREN) 1 % GEL, Apply 2 g topically 4 (four) times daily., Disp: 1 Tube, Rfl: 12 .  donepezil (ARICEPT) 10 MG tablet, TAKE ONE TABLET BY MOUTH ONCE DAILY, Disp: 30 tablet, Rfl: 0 .  latanoprost (XALATAN) 0.005 % ophthalmic solution, , Disp: , Rfl:  .  fluticasone (FLONASE) 50 MCG/ACT nasal spray, Place 2 sprays into both nostrils daily. (Patient not taking: Reported on 06/14/2016), Disp: 16 g, Rfl: 6 .  LORazepam (ATIVAN) 0.5 MG tablet, Take 1 tablet (0.5 mg total) by mouth 2 (two) times daily as needed for anxiety. 1/2 to 1 twice a day prn (Patient not taking: Reported on 06/14/2016), Disp: 30 tablet, Rfl: 3 .  omeprazole (PRILOSEC) 20 MG capsule, TAKE ONE CAPSULE BY MOUTH ONCE DAILY (Patient not taking: Reported on 06/14/2016), Disp: 30 capsule, Rfl: 0  Allergies  Allergen Reactions  . Ace Inhibitors   . Telmisartan      Review of Systems  Constitutional: Negative for chills.  Eyes: Negative for blurred vision.  Respiratory: Negative for shortness of breath.    Cardiovascular: Negative for chest pain and palpitations.  Gastrointestinal: Positive for diarrhea. Negative for vomiting, blood in stool, bloating and flatus.  Musculoskeletal: Negative for myalgias.  Neurological: Negative for headaches.    Objective  Filed Vitals:   06/14/16 1338  BP: 118/64  Pulse: 66  Temp: 98.1 F (36.7 C)  Resp: 16  Height:  (1.626 m)  Weight: 98 lb 6.4 oz (44.634 kg)  SpO2: 97%    Physical Exam  Constitutional: She is well-developed, well-nourished, and in no distress.  HENT:  Head: Normocephalic and atraumatic.  Right nasal cavity inflamed, erythematous, turbinate hypertrophy.  Cardiovascular: Normal rate, regular rhythm, S1 normal, S2 normal and normal heart sounds.   Pulmonary/Chest: Breath sounds normal. No respiratory distress.  Abdominal: Normal appearance and bowel sounds are normal. There is no tenderness.  Neurological: She is alert.  Psychiatric: Affect normal.  Nursing note and vitals reviewed.    Assessment & Plan 1. Memory loss Refill for Aricept provided, patient has dementia without any frequent behavioral disturbance - donepezil (ARICEPT) 10 MG tablet; Take 1 tablet (10 mg total) by mouth daily.  Dispense: 90 tablet; Refill: 0  2. Hyperlipidemia  - atorvastatin (LIPITOR) 40 MG tablet; Take 1 tablet (40 mg total) by mouth daily.  Dispense: 90 tablet; Refill: 0 - Lipid Profile - Comprehensive Metabolic Panel (CMET)  3. Essential hypertension  - amLODipine (NORVASC) 5 MG tablet; Take 1 tablet (5 mg total) by mouth daily.  Dispense: 90 tablet; Refill: 0  4. Diarrhea in adult patient  Likely combination of poor oral intake, versus constipation which may later cause diarrhea. Patient's daughter reports that patint has experienced frequent accidents which have resulted in soiling of clothes. Recommended taking Metamucil to add texture and bulk the stool which will likely lead to normal formed stool. Follow-up if no  improvement  5. Allergic rhinitis, unspecified allergic rhinitis type Symptoms include runny nose, right nasal cavity inflamed. We'll replace fluticasone with mometasone - mometasone (NASONEX) 50 MCG/ACT nasal spray; Place 2 sprays into the nose daily.  Dispense: 17 g; Refill: 2  Note: Total face-to-face time: Approximately 40 minutes, greater than 50% was spent in counseling and coordination of care with the patient. This included review of previous records, obtaining detailed history from patient's daughter, examination and disease management.   Maudy Yonan Asad A. Faylene Kurtz [facility] 06/14/2016 1:51 PM

## 2016-06-15 LAB — COMPREHENSIVE METABOLIC PANEL
ALBUMIN: 3.8 g/dL (ref 3.6–5.1)
ALK PHOS: 84 U/L (ref 33–130)
ALT: 13 U/L (ref 6–29)
AST: 21 U/L (ref 10–35)
BUN: 21 mg/dL (ref 7–25)
CO2: 23 mmol/L (ref 20–31)
CREATININE: 0.87 mg/dL (ref 0.60–0.88)
Calcium: 9.5 mg/dL (ref 8.6–10.4)
Chloride: 107 mmol/L (ref 98–110)
Glucose, Bld: 77 mg/dL (ref 65–99)
POTASSIUM: 4 mmol/L (ref 3.5–5.3)
Sodium: 141 mmol/L (ref 135–146)
TOTAL PROTEIN: 6.7 g/dL (ref 6.1–8.1)
Total Bilirubin: 0.7 mg/dL (ref 0.2–1.2)

## 2016-06-15 LAB — LIPID PANEL
CHOLESTEROL: 191 mg/dL (ref 125–200)
HDL: 94 mg/dL (ref >=46)
LDL Cholesterol: 82 mg/dL (ref <130)
Total CHOL/HDL Ratio: 2 Ratio (ref <=5.0)
Triglycerides: 75 mg/dL (ref <150)
VLDL: 15 mg/dL (ref <30)

## 2016-09-17 ENCOUNTER — Other Ambulatory Visit: Payer: Self-pay | Admitting: Family Medicine

## 2016-09-17 DIAGNOSIS — E785 Hyperlipidemia, unspecified: Secondary | ICD-10-CM

## 2016-09-26 ENCOUNTER — Telehealth: Payer: Self-pay | Admitting: Family Medicine

## 2016-09-26 DIAGNOSIS — I10 Essential (primary) hypertension: Secondary | ICD-10-CM

## 2016-09-26 DIAGNOSIS — E785 Hyperlipidemia, unspecified: Secondary | ICD-10-CM

## 2016-09-26 MED ORDER — ATORVASTATIN CALCIUM 40 MG PO TABS: 40.0000 mg | ORAL_TABLET | Freq: Every day | ORAL | 0 refills | Status: DC

## 2016-09-26 MED ORDER — AMLODIPINE BESYLATE 5 MG PO TABS: 5.0000 mg | ORAL_TABLET | Freq: Every day | ORAL | 0 refills | Status: DC

## 2016-09-26 NOTE — Telephone Encounter (Signed)
Prescription for amlodipine and atorvastatin is sent to patient's pharmacy

## 2016-09-26 NOTE — Telephone Encounter (Signed)
PT NEEDS REFILL ON AMLODIPINE 5MG  SHE IS TOTALLY OUT.Marland Kitchen. ALSO HER CHOLESTEROL MEDS. PHARM [place] [address] [place]

## 2016-09-27 NOTE — Telephone Encounter (Signed)
PT ADVISED.

## 2016-10-19 DIAGNOSIS — Z7982 Long term (current) use of aspirin: Secondary | ICD-10-CM | POA: Diagnosis not present

## 2016-10-19 DIAGNOSIS — K5909 Other constipation: Secondary | ICD-10-CM | POA: Diagnosis not present

## 2016-10-19 DIAGNOSIS — Z9841 Cataract extraction status, right eye: Secondary | ICD-10-CM | POA: Diagnosis not present

## 2016-10-19 DIAGNOSIS — H409 Unspecified glaucoma: Secondary | ICD-10-CM | POA: Diagnosis not present

## 2016-10-19 DIAGNOSIS — Z23 Encounter for immunization: Secondary | ICD-10-CM | POA: Diagnosis not present

## 2016-10-19 DIAGNOSIS — Z9181 History of falling: Secondary | ICD-10-CM | POA: Diagnosis not present

## 2016-10-19 DIAGNOSIS — Z9842 Cataract extraction status, left eye: Secondary | ICD-10-CM | POA: Diagnosis not present

## 2016-10-19 DIAGNOSIS — G301 Alzheimer's disease with late onset: Secondary | ICD-10-CM | POA: Diagnosis not present

## 2016-10-19 DIAGNOSIS — M79604 Pain in right leg: Secondary | ICD-10-CM | POA: Diagnosis not present

## 2016-10-19 DIAGNOSIS — E538 Deficiency of other specified B group vitamins: Secondary | ICD-10-CM | POA: Diagnosis not present

## 2016-10-19 DIAGNOSIS — J309 Allergic rhinitis, unspecified: Secondary | ICD-10-CM | POA: Diagnosis not present

## 2016-10-19 DIAGNOSIS — I341 Nonrheumatic mitral (valve) prolapse: Secondary | ICD-10-CM | POA: Diagnosis not present

## 2016-10-19 DIAGNOSIS — R413 Other amnesia: Secondary | ICD-10-CM | POA: Diagnosis not present

## 2016-10-19 DIAGNOSIS — I10 Essential (primary) hypertension: Secondary | ICD-10-CM | POA: Diagnosis not present

## 2016-10-19 DIAGNOSIS — Z79899 Other long term (current) drug therapy: Secondary | ICD-10-CM | POA: Diagnosis not present

## 2016-10-19 DIAGNOSIS — E0590 Thyrotoxicosis, unspecified without thyrotoxic crisis or storm: Secondary | ICD-10-CM | POA: Diagnosis not present

## 2016-10-19 DIAGNOSIS — M1611 Unilateral primary osteoarthritis, right hip: Secondary | ICD-10-CM | POA: Diagnosis not present

## 2016-10-19 DIAGNOSIS — G629 Polyneuropathy, unspecified: Secondary | ICD-10-CM | POA: Diagnosis not present

## 2016-10-19 DIAGNOSIS — R143 Flatulence: Secondary | ICD-10-CM | POA: Diagnosis not present

## 2016-10-19 DIAGNOSIS — R197 Diarrhea, unspecified: Secondary | ICD-10-CM | POA: Diagnosis not present

## 2016-10-28 DIAGNOSIS — H401133 Primary open-angle glaucoma, bilateral, severe stage: Secondary | ICD-10-CM | POA: Diagnosis not present

## 2016-11-08 ENCOUNTER — Telehealth: Payer: Self-pay | Admitting: Family Medicine

## 2016-11-08 NOTE — Telephone Encounter (Signed)
Called Pt to schedule AWV with [facility] - knb °

## 2016-11-16 DIAGNOSIS — I739 Peripheral vascular disease, unspecified: Secondary | ICD-10-CM | POA: Diagnosis not present

## 2016-11-16 DIAGNOSIS — S81802A Unspecified open wound, left lower leg, initial encounter: Secondary | ICD-10-CM | POA: Diagnosis not present

## 2016-11-21 DIAGNOSIS — I739 Peripheral vascular disease, unspecified: Secondary | ICD-10-CM | POA: Diagnosis not present

## 2016-11-21 DIAGNOSIS — S81802A Unspecified open wound, left lower leg, initial encounter: Secondary | ICD-10-CM | POA: Diagnosis not present

## 2016-12-20 ENCOUNTER — Other Ambulatory Visit: Payer: Self-pay | Admitting: Family Medicine

## 2016-12-20 DIAGNOSIS — I10 Essential (primary) hypertension: Secondary | ICD-10-CM

## 2017-02-17 DIAGNOSIS — H409 Unspecified glaucoma: Secondary | ICD-10-CM | POA: Diagnosis not present

## 2017-02-17 DIAGNOSIS — Z9181 History of falling: Secondary | ICD-10-CM | POA: Diagnosis not present

## 2017-02-17 DIAGNOSIS — M1611 Unilateral primary osteoarthritis, right hip: Secondary | ICD-10-CM | POA: Diagnosis not present

## 2017-02-17 DIAGNOSIS — E0590 Thyrotoxicosis, unspecified without thyrotoxic crisis or storm: Secondary | ICD-10-CM | POA: Diagnosis not present

## 2017-02-17 DIAGNOSIS — E213 Hyperparathyroidism, unspecified: Secondary | ICD-10-CM | POA: Diagnosis not present

## 2017-02-17 DIAGNOSIS — M419 Scoliosis, unspecified: Secondary | ICD-10-CM | POA: Diagnosis not present

## 2017-02-17 DIAGNOSIS — I10 Essential (primary) hypertension: Secondary | ICD-10-CM | POA: Diagnosis not present

## 2017-02-17 DIAGNOSIS — R51 Headache: Secondary | ICD-10-CM | POA: Diagnosis not present

## 2017-02-17 DIAGNOSIS — M79604 Pain in right leg: Secondary | ICD-10-CM | POA: Diagnosis not present

## 2017-02-17 DIAGNOSIS — M5431 Sciatica, right side: Secondary | ICD-10-CM | POA: Diagnosis not present

## 2017-02-17 DIAGNOSIS — G301 Alzheimer's disease with late onset: Secondary | ICD-10-CM | POA: Diagnosis not present

## 2017-02-17 DIAGNOSIS — J309 Allergic rhinitis, unspecified: Secondary | ICD-10-CM | POA: Diagnosis not present

## 2017-11-09 ENCOUNTER — Other Ambulatory Visit: Payer: Self-pay | Admitting: Family Medicine

## 2017-11-09 DIAGNOSIS — E785 Hyperlipidemia, unspecified: Secondary | ICD-10-CM

## 2018-07-25 ENCOUNTER — Encounter: Payer: Self-pay | Admitting: *Deleted

## 2018-07-25 ENCOUNTER — Other Ambulatory Visit: Payer: Self-pay

## 2018-07-25 ENCOUNTER — Emergency Department: Payer: Medicare Other

## 2018-07-25 ENCOUNTER — Inpatient Hospital Stay
Admission: EM | Admit: 2018-07-25 | Discharge: 2018-07-30 | DRG: 194 | Disposition: A | Discharge status: Hospice | Payer: Medicare Other | Attending: Internal Medicine | Admitting: Internal Medicine

## 2018-07-25 DIAGNOSIS — F0280 Dementia in other diseases classified elsewhere without behavioral disturbance: Secondary | ICD-10-CM | POA: Diagnosis present

## 2018-07-25 DIAGNOSIS — K5900 Constipation, unspecified: Secondary | ICD-10-CM | POA: Diagnosis present

## 2018-07-25 DIAGNOSIS — E878 Other disorders of electrolyte and fluid balance, not elsewhere classified: Secondary | ICD-10-CM | POA: Diagnosis present

## 2018-07-25 DIAGNOSIS — H409 Unspecified glaucoma: Secondary | ICD-10-CM | POA: Diagnosis present

## 2018-07-25 DIAGNOSIS — I10 Essential (primary) hypertension: Secondary | ICD-10-CM | POA: Diagnosis present

## 2018-07-25 DIAGNOSIS — R4182 Altered mental status, unspecified: Secondary | ICD-10-CM

## 2018-07-25 DIAGNOSIS — Z515 Encounter for palliative care: Secondary | ICD-10-CM

## 2018-07-25 DIAGNOSIS — L8990 Pressure ulcer of unspecified site, unspecified stage: Secondary | ICD-10-CM

## 2018-07-25 DIAGNOSIS — I248 Other forms of acute ischemic heart disease: Secondary | ICD-10-CM | POA: Diagnosis present

## 2018-07-25 DIAGNOSIS — E440 Moderate protein-calorie malnutrition: Secondary | ICD-10-CM | POA: Diagnosis present

## 2018-07-25 DIAGNOSIS — Z79899 Other long term (current) drug therapy: Secondary | ICD-10-CM

## 2018-07-25 DIAGNOSIS — Z681 Body mass index (BMI) 19 or less, adult: Secondary | ICD-10-CM

## 2018-07-25 DIAGNOSIS — Z7982 Long term (current) use of aspirin: Secondary | ICD-10-CM | POA: Diagnosis not present

## 2018-07-25 DIAGNOSIS — G3109 Other frontotemporal dementia: Secondary | ICD-10-CM | POA: Diagnosis present

## 2018-07-25 DIAGNOSIS — R6510 Systemic inflammatory response syndrome (SIRS) of non-infectious origin without acute organ dysfunction: Secondary | ICD-10-CM | POA: Diagnosis present

## 2018-07-25 DIAGNOSIS — R638 Other symptoms and signs concerning food and fluid intake: Secondary | ICD-10-CM | POA: Diagnosis not present

## 2018-07-25 DIAGNOSIS — Z66 Do not resuscitate: Secondary | ICD-10-CM | POA: Diagnosis present

## 2018-07-25 DIAGNOSIS — R7989 Other specified abnormal findings of blood chemistry: Secondary | ICD-10-CM | POA: Diagnosis present

## 2018-07-25 DIAGNOSIS — J90 Pleural effusion, not elsewhere classified: Secondary | ICD-10-CM | POA: Diagnosis present

## 2018-07-25 DIAGNOSIS — R531 Weakness: Secondary | ICD-10-CM

## 2018-07-25 DIAGNOSIS — M1990 Unspecified osteoarthritis, unspecified site: Secondary | ICD-10-CM | POA: Diagnosis present

## 2018-07-25 DIAGNOSIS — Z888 Allergy status to other drugs, medicaments and biological substances status: Secondary | ICD-10-CM

## 2018-07-25 DIAGNOSIS — Z7401 Bed confinement status: Secondary | ICD-10-CM | POA: Diagnosis not present

## 2018-07-25 DIAGNOSIS — J189 Pneumonia, unspecified organism: Secondary | ICD-10-CM | POA: Diagnosis present

## 2018-07-25 DIAGNOSIS — Z7189 Other specified counseling: Secondary | ICD-10-CM | POA: Diagnosis not present

## 2018-07-25 DIAGNOSIS — N179 Acute kidney failure, unspecified: Secondary | ICD-10-CM | POA: Diagnosis present

## 2018-07-25 DIAGNOSIS — R778 Other specified abnormalities of plasma proteins: Secondary | ICD-10-CM

## 2018-07-25 DIAGNOSIS — E870 Hyperosmolality and hypernatremia: Secondary | ICD-10-CM | POA: Diagnosis present

## 2018-07-25 LAB — COMPREHENSIVE METABOLIC PANEL
ALBUMIN: 4.3 g/dL (ref 3.5–5.0)
ALK PHOS: 65 U/L (ref 38–126)
ALT: 39 U/L (ref 0–44)
AST: 42 U/L — AB (ref 15–41)
Anion gap: 12 (ref 5–15)
BILIRUBIN TOTAL: 1.4 mg/dL — AB (ref 0.3–1.2)
BUN: 44 mg/dL — AB (ref 8–23)
CALCIUM: 9.7 mg/dL (ref 8.9–10.3)
CO2: 21 mmol/L — AB (ref 22–32)
Chloride: 117 mmol/L — ABNORMAL HIGH (ref 98–111)
Creatinine, Ser: 1.35 mg/dL — ABNORMAL HIGH (ref 0.44–1.00)
GFR calc Af Amer: 36 mL/min — ABNORMAL LOW (ref >60)
GFR calc non Af Amer: 31 mL/min — ABNORMAL LOW (ref >60)
GLUCOSE: 109 mg/dL — AB (ref 70–99)
Potassium: 4.4 mmol/L (ref 3.5–5.1)
Sodium: 150 mmol/L — ABNORMAL HIGH (ref 135–145)
TOTAL PROTEIN: 7.4 g/dL (ref 6.5–8.1)

## 2018-07-25 LAB — URINALYSIS, COMPLETE (UACMP) WITH MICROSCOPIC
Bilirubin Urine: NEGATIVE
GLUCOSE, UA: NEGATIVE mg/dL
HGB URINE DIPSTICK: NEGATIVE
Ketones, ur: NEGATIVE mg/dL
LEUKOCYTES UA: NEGATIVE
Nitrite: NEGATIVE
Protein, ur: 100 mg/dL — AB
Specific Gravity, Urine: 1.03 (ref 1.005–1.030)
pH: 5 (ref 5.0–8.0)

## 2018-07-25 LAB — CBC
HCT: 52.8 % — ABNORMAL HIGH (ref 35.0–47.0)
HEMOGLOBIN: 16.7 g/dL — AB (ref 12.0–16.0)
MCH: 29.5 pg (ref 26.0–34.0)
MCHC: 31.6 g/dL — ABNORMAL LOW (ref 32.0–36.0)
MCV: 93.5 fL (ref 80.0–100.0)
Platelets: 130 10*3/uL — ABNORMAL LOW (ref 150–440)
RBC: 5.65 MIL/uL — ABNORMAL HIGH (ref 3.80–5.20)
RDW: 18.3 % — ABNORMAL HIGH (ref 11.5–14.5)
WBC: 5.2 10*3/uL (ref 3.6–11.0)

## 2018-07-25 LAB — TROPONIN I
TROPONIN I: 0.3 ng/mL — AB (ref <0.03)
Troponin I: 0.27 ng/mL (ref <0.03)

## 2018-07-25 MED ORDER — HYDRALAZINE HCL 20 MG/ML IJ SOLN
5.0000 mg | Freq: Once | INTRAMUSCULAR | Status: AC
  Administered 2018-07-25: 5 mg via INTRAVENOUS

## 2018-07-25 MED ORDER — TRAZODONE HCL 50 MG PO TABS
50.0000 mg | ORAL_TABLET | Freq: Every day | ORAL | Status: DC
  Administered 2018-07-26 – 2018-07-28 (×4): 50 mg via ORAL
  Filled 2018-07-25 (×5): qty 1

## 2018-07-25 MED ORDER — HYDRALAZINE HCL 20 MG/ML IJ SOLN
INTRAMUSCULAR | Status: AC
  Administered 2018-07-25: 5 mg via INTRAVENOUS
  Filled 2018-07-25: qty 1

## 2018-07-25 MED ORDER — SODIUM CHLORIDE 0.9 % IV SOLN
500.0000 mg | INTRAVENOUS | Status: DC
  Administered 2018-07-26: 500 mg via INTRAVENOUS
  Filled 2018-07-25 (×2): qty 500

## 2018-07-25 MED ORDER — HEPARIN SODIUM (PORCINE) 5000 UNIT/ML IJ SOLN
5000.0000 [IU] | Freq: Three times a day (TID) | INTRAMUSCULAR | Status: DC
  Administered 2018-07-26 – 2018-07-28 (×8): 5000 [IU] via SUBCUTANEOUS
  Filled 2018-07-25 (×7): qty 1

## 2018-07-25 MED ORDER — FUROSEMIDE 20 MG PO TABS
20.0000 mg | ORAL_TABLET | Freq: Every day | ORAL | Status: DC
  Administered 2018-07-26 – 2018-07-27 (×2): 20 mg via ORAL
  Filled 2018-07-25 (×2): qty 1

## 2018-07-25 MED ORDER — METOPROLOL TARTRATE 5 MG/5ML IV SOLN
2.5000 mg | Freq: Four times a day (QID) | INTRAVENOUS | Status: DC | PRN
  Administered 2018-07-26: 2.5 mg via INTRAVENOUS
  Filled 2018-07-25: qty 5

## 2018-07-25 MED ORDER — SODIUM CHLORIDE 0.9 % IV SOLN
500.0000 mg | Freq: Once | INTRAVENOUS | Status: AC
  Administered 2018-07-25: 500 mg via INTRAVENOUS
  Filled 2018-07-25: qty 500

## 2018-07-25 MED ORDER — DICLOFENAC SODIUM 1 % TD GEL
2.0000 g | Freq: Four times a day (QID) | TRANSDERMAL | Status: DC
  Administered 2018-07-26 – 2018-07-27 (×7): 2 g via TOPICAL
  Filled 2018-07-25: qty 100

## 2018-07-25 MED ORDER — SODIUM CHLORIDE 0.9 % IV SOLN
1.0000 g | Freq: Once | INTRAVENOUS | Status: AC
  Administered 2018-07-25: 1 g via INTRAVENOUS
  Filled 2018-07-25: qty 10

## 2018-07-25 MED ORDER — ENOXAPARIN SODIUM 40 MG/0.4ML ~~LOC~~ SOLN: 40.0000 mg | SUBCUTANEOUS | Status: DC

## 2018-07-25 MED ORDER — DORZOLAMIDE HCL-TIMOLOL MAL 2-0.5 % OP SOLN
1.0000 [drp] | Freq: Two times a day (BID) | OPHTHALMIC | Status: DC
  Administered 2018-07-26 – 2018-07-27 (×5): 1 [drp] via OPHTHALMIC
  Filled 2018-07-25: qty 10

## 2018-07-25 MED ORDER — ONDANSETRON HCL 4 MG/2ML IJ SOLN: 4.0000 mg | Freq: Four times a day (QID) | INTRAMUSCULAR | Status: DC | PRN

## 2018-07-25 MED ORDER — SODIUM CHLORIDE 0.9 % IV BOLUS
1000.0000 mL | Freq: Once | INTRAVENOUS | Status: AC
  Administered 2018-07-25: 1000 mL via INTRAVENOUS

## 2018-07-25 MED ORDER — DEXTROSE 5 % IV SOLN
INTRAVENOUS | Status: DC
  Administered 2018-07-25 – 2018-07-27 (×3): via INTRAVENOUS

## 2018-07-25 MED ORDER — CARVEDILOL 6.25 MG PO TABS
6.2500 mg | ORAL_TABLET | Freq: Two times a day (BID) | ORAL | Status: DC
  Administered 2018-07-26 – 2018-07-27 (×5): 6.25 mg via ORAL
  Filled 2018-07-25 (×5): qty 1

## 2018-07-25 MED ORDER — BRIMONIDINE TARTRATE 0.2 % OP SOLN
1.0000 [drp] | Freq: Two times a day (BID) | OPHTHALMIC | Status: DC
  Administered 2018-07-26 – 2018-07-27 (×5): 1 [drp] via OPHTHALMIC
  Filled 2018-07-25: qty 5

## 2018-07-25 MED ORDER — SODIUM CHLORIDE 0.9 % IV SOLN
1.0000 g | INTRAVENOUS | Status: DC
  Administered 2018-07-26: 1 g via INTRAVENOUS
  Filled 2018-07-25: qty 10
  Filled 2018-07-25: qty 1

## 2018-07-25 NOTE — ED Notes (Signed)
Critical result called from lab, lactic 0.30

## 2018-07-25 NOTE — ED Notes (Signed)
Admitting MD at bedside.

## 2018-07-25 NOTE — H&P (Signed)
[facility] [place] [facility]   PATIENT NAME: Monica Vaughn    MR#:  [id number]  DATE OF BIRTH:  1917-03-14  DATE OF ADMISSION:  07/25/2018  PRIMARY CARE PHYSICIAN: System, Pcp Not In   REQUESTING/REFERRING PHYSICIAN:   CHIEF COMPLAINT: AMS  HISTORY OF PRESENT ILLNESS: Monica Vaughn  is a 84 y.o. female with a known history past medical history per below brought into the emergency room for 2 to 3-day history of worsening change in mental status, less active, more emotional outbursts, yelling, in the emergency room patient was found to have acute pneumonia on chest x-ray, creatinine 1.3, troponin 0.3, hemoglobin 16, sodium 150, chloride 117, tachycardic and tachypneic, patient evaluated at the bedside-poor historian due to dementia, daughters at the bedside, patient is DNR, patient now been admitted for acute pneumonia with SIRS.  PAST MEDICAL HISTORY:   Past Medical History:  Diagnosis Date  . Cataract   . Frontotemporal dementia   . Glaucoma   . Hypertension   . Osteoarthrosis     PAST SURGICAL HISTORY:  Past Surgical History:  Procedure Laterality Date  . APPENDECTOMY    . CATARACT EXTRACTION      SOCIAL HISTORY:  Social History   Tobacco Use  . Smoking status: Never Smoker  . Smokeless tobacco: Never Used  Substance Use Topics  . Alcohol use: No    Alcohol/week: 0.0 oz    FAMILY HISTORY:  HTN  DRUG ALLERGIES:  Allergies  Allergen Reactions  . Ace Inhibitors   . Telmisartan     REVIEW OF SYSTEMS:   CONSTITUTIONAL: No fever, fatigue or weakness.  EYES: No blurred or double vision.  EARS, NOSE, AND THROAT: No tinnitus or ear pain.  RESPIRATORY: No cough, shortness of breath, wheezing or hemoptysis.  CARDIOVASCULAR: No chest pain, orthopnea, edema.  GASTROINTESTINAL: No nausea, vomiting, diarrhea or abdominal pain.  GENITOURINARY: No dysuria, hematuria.  ENDOCRINE: No polyuria, nocturia,  HEMATOLOGY: No anemia, easy  bruising or bleeding SKIN: No rash or lesion. MUSCULOSKELETAL: No joint pain or arthritis.   NEUROLOGIC: No tingling, numbness, weakness.  PSYCHIATRY: No anxiety or depression.   MEDICATIONS AT HOME:  Prior to Admission medications   Medication Sig Start Date End Date Taking? Authorizing Provider  brimonidine (ALPHAGAN) 0.2 % ophthalmic solution Place 1 drop into both eyes 2 (two) times daily. 06/25/18  Yes [provider]  carvedilol (COREG) 3.125 MG tablet Take 3.125 mg by mouth 2 (two) times daily. 06/01/18  Yes [provider]  colchicine 0.6 MG tablet Take 1 tablet by mouth daily. 07/10/18  Yes [provider]  diclofenac sodium (VOLTAREN) 1 % GEL Apply 2 g topically 4 (four) times daily. 11/16/15  Yes Dennison Mascot, MD  dorzolamide-timolol (COSOPT) 22.3-6.8 MG/ML ophthalmic solution Place 1 drop into both eyes 2 (two) times daily. 06/23/18  Yes [provider]  furosemide (LASIX) 20 MG tablet Take 1 tablet by mouth daily. 07/18/18  Yes [provider]  Polyethylene Glycol 3350 (PEG 3350) POWD Take 17 g by mouth daily as needed. 12/27/17 12/27/18 Yes [provider]      PHYSICAL EXAMINATION:   VITAL SIGNS: Blood pressure (!) 164/129, pulse (!) 119, temperature 97.6 F (36.4 C), temperature source Oral, resp. rate (!) 23, weight 44.5 kg (98 lb), SpO2 (!) 73 %.  GENERAL:  84 y.o.-year-old patient lying in the bed with no acute distress.  EYES: Pupils equal, round, reactive to light and accommodation. No scleral icterus. Extraocular muscles intact.  HEENT: Head atraumatic, normocephalic. Oropharynx and nasopharynx clear.  NECK:  Supple, no jugular venous distention. No thyroid enlargement, no tenderness.  LUNGS: Normal breath sounds bilaterally, no wheezing, rales,rhonchi or crepitation. No use of accessory muscles of respiration.  CARDIOVASCULAR: S1, S2 normal. No murmurs, rubs, or gallops.  ABDOMEN: Soft, nontender, nondistended. Bowel  sounds present. No organomegaly or mass.  EXTREMITIES: No pedal edema, cyanosis, or clubbing.  NEUROLOGIC: Cranial nerves II through XII are intact. Muscle strength 5/5 in all extremities. Sensation intact. Gait not checked.  PSYCHIATRIC: The patient is alert and oriented x 3.  SKIN: No obvious rash, lesion, or ulcer.   LABORATORY PANEL:   CBC Recent Labs  Lab 07/25/18 1621  WBC 5.2  HGB 16.7*  HCT 52.8*  PLT 130*  MCV 93.5  MCH 29.5  MCHC 31.6*  RDW 18.3*   ------------------------------------------------------------------------------------------------------------------  Chemistries  Recent Labs  Lab 07/25/18 1621  NA 150*  K 4.4  CL 117*  CO2 21*  GLUCOSE 109*  BUN 44*  CREATININE 1.35*  CALCIUM 9.7  AST 42*  ALT 39  ALKPHOS 65  BILITOT 1.4*   ------------------------------------------------------------------------------------------------------------------ CrCl cannot be calculated (Unknown ideal weight.). ------------------------------------------------------------------------------------------------------------------ No results for input(s): TSH, T4TOTAL, T3FREE, THYROIDAB in the last 72 hours.  Invalid input(s): FREET3   Coagulation profile No results for input(s): INR, PROTIME in the last 168 hours. ------------------------------------------------------------------------------------------------------------------- No results for input(s): DDIMER in the last 72 hours. -------------------------------------------------------------------------------------------------------------------  Cardiac Enzymes Recent Labs  Lab 07/25/18 1621 07/25/18 1957  TROPONINI 0.30* 0.27*   ------------------------------------------------------------------------------------------------------------------ Invalid input(s): POCBNP  ---------------------------------------------------------------------------------------------------------------  Urinalysis    Component Value  Date/Time   COLORURINE AMBER (A) 07/25/2018 1816   APPEARANCEUR HAZY (A) 07/25/2018 1816   APPEARANCEUR Clear 03/31/2013 0906   LABSPEC 1.030 07/25/2018 1816   LABSPEC 1.018 03/31/2013 0906   PHURINE 5.0 07/25/2018 1816   GLUCOSEU NEGATIVE 07/25/2018 1816   GLUCOSEU Negative 03/31/2013 0906   HGBUR NEGATIVE 07/25/2018 1816   BILIRUBINUR NEGATIVE 07/25/2018 1816   BILIRUBINUR Negative 03/31/2013 0906   KETONESUR NEGATIVE 07/25/2018 1816   PROTEINUR 100 (A) 07/25/2018 1816   NITRITE NEGATIVE 07/25/2018 1816   LEUKOCYTESUR NEGATIVE 07/25/2018 1816   LEUKOCYTESUR Negative 03/31/2013 0906     RADIOLOGY: Dg Chest 2 View  Result Date: 07/25/2018 CLINICAL DATA:  Altered mental status, weakness EXAM: CHEST - 2 VIEW COMPARISON:  03/31/2013 FINDINGS: Mild patchy opacities in the medial right upper lobe and right lower lobe. Additional mild patchy retrocardiac opacity. Small bilateral pleural effusions, left greater than right. Cardiomegaly. Mild degenerative changes of the visualized thoracolumbar spine. IMPRESSION: Mild patchy right lung opacities, suspicious for pneumonia. Mild retrocardiac opacity, left lower lobe atelectasis versus pneumonia. Small bilateral pleural effusions, left greater than right. Electronically Signed   By: Charline Bills M.D.   On: 07/25/2018 19:35   Ct Head Wo Contrast  Result Date: 07/25/2018 CLINICAL DATA:  Altered mental status, dementia EXAM: CT HEAD WITHOUT CONTRAST TECHNIQUE: Contiguous axial images were obtained from the base of the skull through the vertex without intravenous contrast. COMPARISON:  03/31/2013 FINDINGS: Brain: No evidence of acute infarction, hemorrhage, extra-axial collection or mass lesion/mass effect. Global cortical and central atrophy. Secondary ventricular prominence. Subcortical white matter and periventricular small vessel ischemic changes. Vascular: Mild intracranial atherosclerosis. Skull: Normal. Negative for fracture or focal lesion.  Sinuses/Orbits: The visualized paranasal sinuses are essentially clear. The mastoid air cells are unopacified. Other: None. IMPRESSION: No evidence of acute intracranial abnormality. Atrophy with small vessel ischemic changes. Electronically  Signed   By: Charline BillsSriyesh  Krishnan M.D.   On: 07/25/2018 19:33    EKG: Orders placed or performed during the hospital encounter of 07/25/18  . EKG 12-Lead  . EKG 12-Lead  . ED EKG  . ED EKG    IMPRESSION AND PLAN: *Acute community-acquired pneumonia *Acute SIRS *Acute kidney injury *Acute hypernatremia/hyperchloremia *Chronic dementia *Chronic benign essential hypertension *Acute elevated troponin secondary to demand ischemia  Admit to regular nursing for bed on our pneumonia protocol, IV fluids for rehydration, empiric Rocephin/azithromycin, follow-up on cultures, gentle IV fluids for rehydration, aspiration/fall precautions, increase nursing care PRN, supplemental oxygen PRN, continue close medical monitoring   All the records are reviewed and case discussed with ED provider. Management plans discussed with the patient, family and they are in agreement.  CODE STATUS:dnr    TOTAL TIME TAKING CARE OF THIS PATIENT: 45 minutes.    Evelena AsaMontell D Salary M.D on 07/25/2018   Between 7am to 6pm - Pager - [phone]  After 6pm go to [email or web address] - password [email or web address]  [facility] [place] [facility]  Office  [phone]  CC: Primary care physician; System, Pcp Not In   Note: This dictation was prepared with Dragon dictation along with smaller phrase technology. Any transcriptional errors that result from this process are unintentional.

## 2018-07-25 NOTE — Progress Notes (Signed)
Family Meeting Note  Advance Directive:yes  Today a meeting took place with the Patient, daughter.  Patient is unable to participate due IO:NGEXBMto:Lacked capacity Dementia   The following clinical team members were present during this meeting:MD  The following were discussed:Patient's diagnosis: Dementia, pneumonia, Patient's progosis: Unable to determine and Goals for treatment: DNR  Additional follow-up to be provided: prn  Time spent during discussion:20 minutes  Bertrum SolMontell D Cleburne Savini, MD

## 2018-07-25 NOTE — ED Notes (Signed)
Report received, care assumed.

## 2018-07-25 NOTE — ED Triage Notes (Signed)
PT to ED after family reports she has been yelling at night throughout this week which is new for her. Pt has a hx of dementia. Pt is confused at baseline but family is unsure her normal orientation level. Pt is not oriented to herself, others, situation or time. Home health nurse told family to bring her to the ED after noting decreased mobility and decreased energy level.   Pt has a dry cough in triage.

## 2018-07-25 NOTE — ED Notes (Signed)
Patient transported to room 236 by this EDT.

## 2018-07-25 NOTE — ED Provider Notes (Signed)
[facility] Emergency Department Provider Note  ____________________________________________   I have reviewed the triage vital signs and the nursing notes.   HISTORY  Chief Complaint AMS  History limited by and level 5 caveat due to AMS/Dementia.    HPI Monica Vaughn is a 84 y.o. female who presents to the emergency department today brought in by family because of concern for altered mental status. The patient is normally able to carry on conversations and talk in complete sentences. For the past two night apparently the patient has been screaming. Today the patient was much less responsive and weaker than normal. The patient's family have not noticed any fevers, no difficulty with breathing, no vomiting or diarrhea.    Per medical record review patient has a history of HTN.  Past Medical History:  Diagnosis Date  . Cataract   . Frontotemporal dementia   . Glaucoma   . Hypertension   . Osteoarthrosis     Patient Active Problem List   Diagnosis Date Noted  . Diarrhea in adult patient 06/14/2016  . Rhinitis 06/14/2016  . Essential hypertension 07/14/2015  . Hyperlipidemia 07/14/2015  . Protein calorie malnutrition (HCC) 07/14/2015  . Underweight due to inadequate caloric intake 07/14/2015  . Memory loss 07/14/2015  . Nail fungus 07/14/2015  . PAD (peripheral artery disease) (HCC) 07/14/2015  . Atherosclerosis 07/14/2015    Past Surgical History:  Procedure Laterality Date  . APPENDECTOMY    . CATARACT EXTRACTION      Prior to Admission medications   Medication Sig Start Date End Date Taking? Authorizing Provider  brimonidine (ALPHAGAN) 0.2 % ophthalmic solution Place 1 drop into both eyes 2 (two) times daily. 06/25/18  Yes [provider]  carvedilol (COREG) 3.125 MG tablet Take 3.125 mg by mouth 2 (two) times daily. 06/01/18  Yes [provider]  colchicine 0.6 MG tablet Take 1 tablet by mouth daily. 07/10/18  Yes  [provider]  diclofenac sodium (VOLTAREN) 1 % GEL Apply 2 g topically 4 (four) times daily. 11/16/15  Yes Dennison Mascot, MD  dorzolamide-timolol (COSOPT) 22.3-6.8 MG/ML ophthalmic solution Place 1 drop into both eyes 2 (two) times daily. 06/23/18  Yes [provider]  furosemide (LASIX) 20 MG tablet Take 1 tablet by mouth daily. 07/18/18  Yes [provider]  Polyethylene Glycol 3350 (PEG 3350) POWD Take 17 g by mouth daily as needed. 12/27/17 12/27/18 Yes [provider]  amLODipine (NORVASC) 5 MG tablet Take 1 tablet (5 mg total) by mouth daily. Patient not taking: Reported on 07/25/2018 09/26/16   Ellyn Hack, MD  aspirin EC 81 MG tablet Take 1 tablet (81 mg total) by mouth daily. Patient not taking: Reported on 07/25/2018 07/14/15   Dennison Mascot, MD  atorvastatin (LIPITOR) 40 MG tablet Take 1 tablet (40 mg total) by mouth daily. Patient not taking: Reported on 07/25/2018 09/26/16   Ellyn Hack, MD  donepezil (ARICEPT) 10 MG tablet Take 1 tablet (10 mg total) by mouth daily. Patient not taking: Reported on 07/25/2018 06/14/16   Ellyn Hack, MD  LORazepam (ATIVAN) 0.5 MG tablet Take 1 tablet (0.5 mg total) by mouth 2 (two) times daily as needed for anxiety. 1/2 to 1 twice a day prn Patient not taking: Reported on 06/14/2016 11/16/15   Dennison Mascot, MD  mometasone (NASONEX) 50 MCG/ACT nasal spray Place 2 sprays into the nose daily. Patient not taking: Reported on 07/25/2018 06/14/16   Ellyn Hack, MD  Allergies Ace inhibitors and Telmisartan  History reviewed. No pertinent family history.  Social History Social History   Tobacco Use  . Smoking status: Never Smoker  . Smokeless tobacco: Never Used  Substance Use Topics  . Alcohol use: No    Alcohol/week: 0.0 oz  . Drug use: No    Review of Systems Unable to obtain secondary to AMS  ____________________________________________   PHYSICAL EXAM:  VITAL SIGNS: ED  Triage Vitals  Enc Vitals Group     BP 07/25/18 1616 (!) 164/90     Pulse Rate 07/25/18 1616 65     Resp 07/25/18 1616 16     Temp 07/25/18 1616 97.6 F (36.4 C)     Temp Source 07/25/18 1616 Oral     SpO2 07/25/18 1616 95 %     Weight 07/25/18 1617 98 lb (44.5 kg)   Constitutional: Somnolent. Will awaken to verbal stimuli.  Eyes: Conjunctivae are normal.  ENT      Head: Normocephalic and atraumatic.      Nose: No congestion/rhinnorhea.      Mouth/Throat: Mucous membranes are moist.      Neck: No stridor. Hematological/Lymphatic/Immunilogical: No cervical lymphadenopathy. Cardiovascular: Normal rate, regular rhythm.  No murmurs, rubs, or gallops.  Respiratory: Normal respiratory effort without tachypnea nor retractions. Breath sounds are clear and equal bilaterally. No wheezes/rales/rhonchi. Gastrointestinal: Soft and non tender. No rebound. No guarding.  Genitourinary: Deferred Musculoskeletal: Normal range of motion in all extremities. No lower extremity edema. Neurologic:  Somnolent awakens to verbal stimuli. Appears to move all extremities.  Skin:  Skin is warm, dry and intact. No rash noted.  ____________________________________________    LABS (pertinent positives/negatives)  CMP na 150, glu 109, cr 1.35 CBC wbc 5.2, hgb 16.7, plt 130 Trop 0.03 UA hazy, negative nitrite, leukocytes, 0-5 rbcs and wbcs, rare bacteria ____________________________________________   EKG  I, Phineas SemenGraydon Levon Penning, attending physician, personally viewed and interpreted this EKG  EKG Time: 1606 Rate: 69 Rhythm: normal sinus rhythm Axis: left axis deviation Intervals: qtc 469 QRS: LVH, LAFB ST changes: no st elevation Impression: abnormal ekg   ____________________________________________    RADIOLOGY  CT head No acute disease  CXR Concern for  pneumonia  ____________________________________________   PROCEDURES  Procedures  ____________________________________________   INITIAL IMPRESSION / ASSESSMENT AND PLAN / ED COURSE  Pertinent labs & imaging results that were available during my care of the patient were reviewed by me and considered in my medical decision making (see chart for details).   Presented to the emergency department today because of concerns for altered mental status increased confusion.  Differential would be broad.  Work-up was seen for possible pneumonia on x-ray.  Additionally troponin was minimally elevated.  At this point I doubt ACS.  Will have her plan on admission.  Will start IV antibiotics.  Discussed findings with family.   ____________________________________________   FINAL CLINICAL IMPRESSION(S) / ED DIAGNOSES  Final diagnoses:  Altered mental status, unspecified altered mental status type  Community acquired pneumonia, unspecified laterality  Elevated troponin     Note: This dictation was prepared with Office managerDragon dictation. Any transcriptional errors that result from this process are unintentional     Phineas SemenGoodman, Karisha Marlin, MD 07/25/18 2027

## 2018-07-26 DIAGNOSIS — E440 Moderate protein-calorie malnutrition: Secondary | ICD-10-CM

## 2018-07-26 DIAGNOSIS — L8990 Pressure ulcer of unspecified site, unspecified stage: Secondary | ICD-10-CM

## 2018-07-26 LAB — BASIC METABOLIC PANEL
Anion gap: 9 (ref 5–15)
BUN: 38 mg/dL — AB (ref 8–23)
CHLORIDE: 117 mmol/L — AB (ref 98–111)
CO2: 21 mmol/L — ABNORMAL LOW (ref 22–32)
CREATININE: 1.11 mg/dL — AB (ref 0.44–1.00)
Calcium: 8.9 mg/dL (ref 8.9–10.3)
GFR calc Af Amer: 46 mL/min — ABNORMAL LOW (ref >60)
GFR calc non Af Amer: 40 mL/min — ABNORMAL LOW (ref >60)
Glucose, Bld: 144 mg/dL — ABNORMAL HIGH (ref 70–99)
Potassium: 3.7 mmol/L (ref 3.5–5.1)
SODIUM: 147 mmol/L — AB (ref 135–145)

## 2018-07-26 LAB — STREP PNEUMONIAE URINARY ANTIGEN: Strep Pneumo Urinary Antigen: NEGATIVE

## 2018-07-26 MED ORDER — ENSURE ENLIVE PO LIQD
237.0000 mL | Freq: Two times a day (BID) | ORAL | Status: DC
  Administered 2018-07-26 – 2018-07-27 (×2): 237 mL via ORAL

## 2018-07-26 NOTE — Progress Notes (Signed)
Attempted two IV but was unsuccessful. Place a IV team consult. Will continue to monitor.

## 2018-07-26 NOTE — Progress Notes (Signed)
Initial Nutrition Assessment  DOCUMENTATION CODES:   Underweight, Non-severe (moderate) malnutrition in context of chronic illness  INTERVENTION:   - Ensure Enlive po BID, each supplement provides 350 kcal and 20 grams of protein  - Encourage adequate PO intake  NUTRITION DIAGNOSIS:   Moderate Malnutrition related to chronic illness (dementia) as evidenced by moderate fat depletion, moderate muscle depletion, severe muscle depletion.  GOAL:   Patient will meet greater than or equal to 90% of their needs  MONITOR:   PO intake, Weight trends, Supplement acceptance, Labs, Skin  REASON FOR ASSESSMENT:   Other (underweight BMI)    ASSESSMENT:   84 year old female who presented to the ED with AMS. PMH significant for dementia, hypertension, hyperlipidemia, and osteoarthritis.  Spoke with pt's daughter at bedside as pt was asleep and difficult to arouse.  Pt's daughter states that she just ordered lunch for pt: chicken noodle soup, fruit, 1/2 of chicken salad sandwich, and sweet tea. Pt's daughter reports that she fed some of pt's breakfast to her this morning (oatmeal) but that pt did not eat much.  Pt's daughter reports that pt typically eats "sufficiently" but that she has "never been a big eater." Pt's daughter shares hat pt does not eat meat because she pockets it and spits it out later. Pt's daughter shares that pt has been "declining in all areas" over the past week and has not been eating much or drinking fluids. Pt's daughter is willing to have pt try Ensure Enlive oral nutrition supplements. Pt has had diarrhea when having Boost (<1 daily) before due to lactose intolerance. RD to order Ensure Enlive daily and monitor for acceptance.  Pt's daughter endorses weight loss stating that pt has gradually lost weight from 112-113 to 107 lbs over the past 6 months. The last weight in pt's chart is from June 2017 (98 lbs).  Medications reviewed and include: 20 mg Lasix daily  Labs  reviewed: sodium 147 (H), chloride 117 (H), CO2 21 (L), BUN 38 (H), creatinine 1.11 (H)  NUTRITION - FOCUSED PHYSICAL EXAM:    Most Recent Value  Orbital Region  Moderate depletion  Upper Arm Region  Moderate depletion  Thoracic and Lumbar Region  Moderate depletion  Buccal Region  Moderate depletion  Temple Region  Moderate depletion  Clavicle Bone Region  Severe depletion  Clavicle and Acromion Bone Region  Severe depletion  Scapular Bone Region  Unable to assess  Dorsal Hand  Moderate depletion  Patellar Region  Moderate depletion  Anterior Thigh Region  Moderate depletion  Posterior Calf Region  Moderate depletion  Edema (RD Assessment)  Moderate  Hair  Reviewed  Eyes  Reviewed  Mouth  Reviewed  Skin  Reviewed  Nails  Reviewed       Diet Order:   Diet Order           Diet regular Room service appropriate? Yes; Fluid consistency: Thin  Diet effective now          EDUCATION NEEDS:   No education needs have been identified at this time  Skin:  Skin Assessment: Skin Integrity Issues: Stage II to coccyx, L toe  Last BM:  unknown/PTA  Height:   Ht Readings from Last 1 Encounters:  07/25/18 5\' 4"  (1.626 m)    Weight:   Wt Readings from Last 1 Encounters:  07/25/18 107 lb 8 oz (48.8 kg)    Ideal Body Weight:  54.55 kg  BMI:  Body mass index is 18.45 kg/m.  Estimated Nutritional  Needs:   Kcal:  1370-1560 kcal/day (28-32 kcal/kg)  Protein:  62-73 grams/day (1.3-1.5 g/kg)  Fluid:  1.4-1.6 L/day    Earma ReadingKate Jablonski Jeren Dufrane, MS, RD, LDN Pager: [phone] Weekend/After Hours: [phone]

## 2018-07-26 NOTE — Progress Notes (Signed)
[facility] [facility] [place] [facility]   PATIENT NAME: Monica Vaughn    MR#:  [id number]  DATE OF BIRTH:  Jul 12, 1917  SUBJECTIVE:  Patient seen and evaluated today Awake and responds to verbal commands Decreased shortness of breath Has poor appetite  REVIEW OF SYSTEMS:    ROS  Could not be obtained completely secondary to dementia   DRUG ALLERGIES:   Allergies  Allergen Reactions  . Ace Inhibitors   . Telmisartan     VITALS:  Blood pressure 126/83, pulse 64, temperature (!) 97.5 F (36.4 C), temperature source Oral, resp. rate 18, height 5\' 4"  (1.626 m), weight 107 lb 8 oz (48.8 kg), SpO2 99 %.  PHYSICAL EXAMINATION:   Physical Exam  GENERAL:  84 y.o.-year-old patient lying in the bed with no acute distress.  EYES: Pupils equal, round, reactive to light and accommodation. No scleral icterus. Extraocular muscles intact.  HEENT: Head atraumatic, normocephalic. Oropharynx and nasopharynx clear.  NECK:  Supple, no jugular venous distention. No thyroid enlargement, no tenderness.  LUNGS: Decreased breath sounds bilaterally, bilateral rales heard. No use of accessory muscles of respiration.  CARDIOVASCULAR: S1, S2 normal. No murmurs, rubs, or gallops.  ABDOMEN: Soft, nontender, nondistended. Bowel sounds present. No organomegaly or mass.  EXTREMITIES: No cyanosis, clubbing or edema b/l.    NEUROLOGIC: Cranial nerves II through XII are intact. No focal Motor or sensory deficits b/l.   PSYCHIATRIC: The patient is alert and oriented x 1  SKIN: No obvious rash, lesion, or ulcer.   LABORATORY PANEL:   CBC Recent Labs  Lab 07/25/18 1621  WBC 5.2  HGB 16.7*  HCT 52.8*  PLT 130*   ------------------------------------------------------------------------------------------------------------------ Chemistries  Recent Labs  Lab 07/25/18 1621 07/26/18 0443  NA 150* 147*  K 4.4 3.7  CL 117* 117*  CO2 21* 21*  GLUCOSE 109* 144*  BUN 44* 38*   CREATININE 1.35* 1.11*  CALCIUM 9.7 8.9  AST 42*  --   ALT 39  --   ALKPHOS 65  --   BILITOT 1.4*  --    ------------------------------------------------------------------------------------------------------------------  Cardiac Enzymes Recent Labs  Lab 07/25/18 1957  TROPONINI 0.27*   ------------------------------------------------------------------------------------------------------------------  RADIOLOGY:  Dg Chest 2 View  Result Date: 07/25/2018 CLINICAL DATA:  Altered mental status, weakness EXAM: CHEST - 2 VIEW COMPARISON:  03/31/2013 FINDINGS: Mild patchy opacities in the medial right upper lobe and right lower lobe. Additional mild patchy retrocardiac opacity. Small bilateral pleural effusions, left greater than right. Cardiomegaly. Mild degenerative changes of the visualized thoracolumbar spine. IMPRESSION: Mild patchy right lung opacities, suspicious for pneumonia. Mild retrocardiac opacity, left lower lobe atelectasis versus pneumonia. Small bilateral pleural effusions, left greater than right. Electronically Signed   By: Charline BillsSriyesh  Krishnan M.D.   On: 07/25/2018 19:35   Ct Head Wo Contrast  Result Date: 07/25/2018 CLINICAL DATA:  Altered mental status, dementia EXAM: CT HEAD WITHOUT CONTRAST TECHNIQUE: Contiguous axial images were obtained from the base of the skull through the vertex without intravenous contrast. COMPARISON:  03/31/2013 FINDINGS: Brain: No evidence of acute infarction, hemorrhage, extra-axial collection or mass lesion/mass effect. Global cortical and central atrophy. Secondary ventricular prominence. Subcortical white matter and periventricular small vessel ischemic changes. Vascular: Mild intracranial atherosclerosis. Skull: Normal. Negative for fracture or focal lesion. Sinuses/Orbits: The visualized paranasal sinuses are essentially clear. The mastoid air cells are unopacified. Other: None. IMPRESSION: No evidence of acute intracranial abnormality. Atrophy  with small vessel ischemic changes. Electronically Signed   By: Lurlean HornsSriyesh  Rito Ehrlich M.D.   On: 07/25/2018 19:33     ASSESSMENT AND PLAN:   84 year old female patient with history of frontotemporal dementia, glaucoma, hypertension currently under hospitalist service for pneumonia  -Community-acquired pneumonia Continue IV Rocephin and Zithromax antibiotics  -Systemic inflammatory response syndrome secondary to pneumonia improving  -Acute kidney injury Responded to IV fluids  -Pleural effusion secondary to pneumonia On Lasix for diuresis  avoid fluid overload  -Elevated troponin secondary to demand ischemia  -Advanced dementia supportive care  All the records are reviewed and case discussed with Care Management/Social Worker. Management plans discussed with the patient, family and they are in agreement.  CODE STATUS: DNR  DVT Prophylaxis: SCDs  TOTAL TIME TAKING CARE OF THIS PATIENT: 35 minutes.   POSSIBLE D/C IN 2 to 3 DAYS, DEPENDING ON CLINICAL CONDITION.  Ihor Austin M.D on 07/26/2018 at 1:06 PM  Between 7am to 6pm - Pager - [phone]  After 6pm go to [email or web address] - password [email or web address]  [facility]  Office  [phone]  CC: Primary care physician; System, Pcp Not In  Note: This dictation was prepared with Dragon dictation along with smaller phrase technology. Any transcriptional errors that result from this process are unintentional.

## 2018-07-26 NOTE — Plan of Care (Signed)
  Problem: Clinical Measurements: Goal: Will remain free from infection Outcome: Progressing Goal: Diagnostic test results will improve Outcome: Progressing   Problem: Nutrition: Goal: Adequate nutrition will be maintained Outcome: Progressing   Problem: Safety: Goal: Ability to remain free from injury will improve Outcome: Progressing   Problem: Skin Integrity: Goal: Risk for impaired skin integrity will decrease Outcome: Progressing   

## 2018-07-27 LAB — CBC
HCT: 47.5 % — ABNORMAL HIGH (ref 35.0–47.0)
Hemoglobin: 15.2 g/dL (ref 12.0–16.0)
MCH: 29.7 pg (ref 26.0–34.0)
MCHC: 32.1 g/dL (ref 32.0–36.0)
MCV: 92.6 fL (ref 80.0–100.0)
PLATELETS: 97 10*3/uL — AB (ref 150–440)
RBC: 5.12 MIL/uL (ref 3.80–5.20)
RDW: 17.9 % — AB (ref 11.5–14.5)
WBC: 3.7 10*3/uL (ref 3.6–11.0)

## 2018-07-27 LAB — BASIC METABOLIC PANEL
Anion gap: 8 (ref 5–15)
BUN: 32 mg/dL — AB (ref 8–23)
CALCIUM: 8.5 mg/dL — AB (ref 8.9–10.3)
CO2: 21 mmol/L — ABNORMAL LOW (ref 22–32)
Chloride: 111 mmol/L (ref 98–111)
Creatinine, Ser: 1.33 mg/dL — ABNORMAL HIGH (ref 0.44–1.00)
GFR calc Af Amer: 37 mL/min — ABNORMAL LOW (ref >60)
GFR calc non Af Amer: 32 mL/min — ABNORMAL LOW (ref >60)
GLUCOSE: 126 mg/dL — AB (ref 70–99)
Potassium: 3.8 mmol/L (ref 3.5–5.1)
Sodium: 140 mmol/L (ref 135–145)

## 2018-07-27 LAB — LEGIONELLA PNEUMOPHILA SEROGP 1 UR AG: L. PNEUMOPHILA SEROGP 1 UR AG: NEGATIVE

## 2018-07-27 MED ORDER — BISACODYL 10 MG RE SUPP: 10.0000 mg | Freq: Every day | RECTAL | Status: DC | PRN

## 2018-07-27 MED ORDER — DOCUSATE SODIUM 100 MG PO CAPS
100.0000 mg | ORAL_CAPSULE | Freq: Two times a day (BID) | ORAL | Status: DC
  Administered 2018-07-27 – 2018-07-28 (×3): 100 mg via ORAL
  Filled 2018-07-27 (×4): qty 1

## 2018-07-27 MED ORDER — AZITHROMYCIN 250 MG PO TABS: 500.0000 mg | ORAL_TABLET | Freq: Every day | ORAL | Status: DC

## 2018-07-27 MED ORDER — OCUVITE-LUTEIN PO CAPS
1.0000 | ORAL_CAPSULE | Freq: Every day | ORAL | Status: DC
  Administered 2018-07-27: 1 via ORAL
  Filled 2018-07-27 (×2): qty 1

## 2018-07-27 MED ORDER — LEVOFLOXACIN IN D5W 500 MG/100ML IV SOLN
500.0000 mg | INTRAVENOUS | Status: DC
  Administered 2018-07-27: 500 mg via INTRAVENOUS
  Filled 2018-07-27: qty 100

## 2018-07-27 MED ORDER — POLYETHYLENE GLYCOL 3350 17 G PO PACK
17.0000 g | PACK | Freq: Every day | ORAL | Status: DC
  Administered 2018-07-27 – 2018-07-28 (×2): 17 g via ORAL
  Filled 2018-07-27 (×2): qty 1

## 2018-07-27 MED ORDER — FLEET ENEMA 7-19 GM/118ML RE ENEM: 1.0000 | ENEMA | Freq: Every day | RECTAL | Status: DC | PRN

## 2018-07-27 NOTE — Clinical Social Work Note (Addendum)
CSW attempted to call patient's daughter Malachi BondsGloria to discuss SNF placement.  CSW left message awaiting for call back from patient's daughter.  4:15pm  CSW received phone call from patient's daughter in regards to possible SNF placement.  CSW spoke with patient's daughter to discuss role of CSW and process for looking for SNF.  CSW explained the difference of going to SNF under short term rehab or having to pay privately for long term care.  CSW explained that depending on what PT says will determine if insurance will pay for stay at SNF or not.  CSW was given permission by patient's daughter to begin bed search [place], she said she prefers [facility], CSW told patient's [facility], but it will depend on which SNF is able to accept patient.  Ervin KnackEric R. Sincere Berlanga, MSW, Theresia MajorsLCSWA [phone]  07/27/2018 1:39 PM

## 2018-07-27 NOTE — Care Management Important Message (Signed)
Copy of signed IM left with patient in room.  

## 2018-07-27 NOTE — Clinical Social Work Note (Addendum)
Clinical Social Work Assessment  Patient Details  Name: Monica Vaughn MRN: [id number] Date of Birth: 02/21/1917  Date of referral:  07/27/18               Reason for consult:  Facility Placement                Permission sought to share information with:  Family Supports, Magazine features editoracility Contact Representative Permission granted to share information::  Yes, Verbal Permission Granted  Name::     [name], [name] Daughter   [phone]   Agency::  SNF admissions  Relationship::     Contact Information:     Housing/Transportation Living arrangements for the past 2 months:  Single Family Home Source of Information:  Adult Children Patient Interpreter Needed:  None Criminal Activity/Legal Involvement Pertinent to Current Situation/Hospitalization:  No - Comment as needed Significant Relationships:  None Lives with:  Adult Children Do you feel safe going back to the place where you live?  No Need for family participation in patient care:  Yes (Comment)  Care giving concerns:  Patients daughter feels that she needs to place patient in rehab or SNF for long term care.  Patient may end up transitioning to hospice home.   Social Worker assessment / plan:  Patient is a 84 year old female who is alert and oriented by 1.  Patient lives alone, but her daughter who is from [place] stays with her 6 months out of the year, then the other six months patient goes to her son's house in [place].  Patient has been to rehab in the past, patient's daughter would prefer [facility].  Patient's daughter was explained how insurance will pay for the stay and what to expect.  CSW explained to patient's daughter if patient is not able to participate in therapy, she may have to go to SNF under private pay.  Per patient's daughter, physician would like to see if patient improves over the next couple of days.  Patient's daughter was also explained that if patient deteriorates, she may become a candidate for hospice depending  on what physician says. CSW was informed by patient's daughter that she is not eating very much and not responding very much.  Patient's daughter did not express any other questions at this time.  Employment status:  Retired Health and safety inspectornsurance information:  Medicare PT Recommendations:  Skilled Nursing Facility Information / Referral to community resources:  [facility]ult Daycare  Patient/Family's Response to care:  Patient and family agreeable to consider SNF placement for rehab or long term care or if patient becomes appropriate hospice home.  Patient/Family's Understanding of and Emotional Response to Diagnosis, Current Treatment, and Prognosis:  Patient is sleeping and not very responsive, family is okay with patient going to SNF or hospice home whichever is more appropriate.  Emotional Assessment Appearance:  Appears stated age Attitude/Demeanor/Rapport:    Affect (typically observed):  Appropriate, Anxious Orientation:  Oriented to  Time, Oriented to Self, Oriented to Place, Oriented to Situation Alcohol / Substance use:  Never Used Psych involvement (Current and /or in the community):  No (Comment)  Discharge Needs  Concerns to be addressed:  Legal Concerns, Cognitive Concerns Readmission within the last 30 days:    Current discharge risk:  Chronically ill, Cognitively Impaired, Lives alone Barriers to Discharge:  Continued Medical Work up   [place] [employer], Jaeger Trueheart R, LCSWA 07/27/2018, 5:28 PM

## 2018-07-27 NOTE — Progress Notes (Signed)
PHARMACIST - PHYSICIAN COMMUNICATION CONCERNING: Antibiotic IV to Oral Route Change Policy  RECOMMENDATION: This patient is receiving azithromycin by the intravenous route.  Based on criteria approved by the Pharmacy and Therapeutics Committee, the antibiotic(s) is/are being converted to the equivalent oral dose form(s).   DESCRIPTION: These criteria include:  Patient being treated for a respiratory tract infection, urinary tract infection, cellulitis or clostridium difficile associated diarrhea if on metronidazole  The patient is not neutropenic and does not exhibit a GI malabsorption state  The patient is eating (either orally or via tube) and/or has been taking other orally administered medications for a least 24 hours  The patient is improving clinically and has a Tmax < 100.5  If you have questions about this conversion, please contact the Pharmacy Department  []  ( [phone] )  [place] [x]  ( [phone] )  [facility] []  ( [phone]  [place] []  ( [phone] )  [facility] []  ( [phone] )  [facility]  

## 2018-07-27 NOTE — Progress Notes (Signed)
PT Cancellation Note  Patient Details Name: Kelli Churnlizabeth Labat MRN: [id number] DOB: March 21, 1917   Cancelled Treatment:     Order received. Chart reviewed. PT attempted session however pt unable to keep eyes open to perform eval. PT will attempt session on next date pt medically appropriate.  Ron AgeeLogan Romelia Bromell, SPT    Keelan Tripodi 07/27/2018, 1:47 PM

## 2018-07-27 NOTE — Plan of Care (Signed)
  Problem: Safety: Goal: Ability to remain free from injury will improve Outcome: Progressing   Problem: Nutrition: Goal: Adequate nutrition will be maintained Outcome: Not Progressing Note:  Patient eating less than 30% of meals. Encouraged family to bring in patient's favorite meal and to keep encouraging patient to eat. Also letting patient try other flavor ensures to see which she likes more.

## 2018-07-27 NOTE — [place] FL2 (Signed)
[place] MEDICAID FL2 LEVEL OF CARE SCREENING TOOL     IDENTIFICATION  Patient Name: Monica Vaughn Birthdate: 1917/12/21 Sex: female Admission Date (Current Location): 07/25/2018  [place]   Facility and Address:  [facility], [address], [place], [place] [address]      Provider Number: [id number]  Attending Physician Name and Address:  Ihor AustinPyreddy, Pavan, MD  Relative Name and Phone Number:  [name], [name] Daughter   [phone] or Romie JumperJEFFRIES,OLANDER P Relative [phone]     Current Level of Care: Hospital Recommended Level of Care: Skilled Nursing Facility Prior Approval Number:    Date Approved/Denied:   PASRR Number: Pending  Discharge Plan: SNF    Current Diagnoses: Patient Active Problem List   Diagnosis Date Noted  . Pressure injury of skin 07/26/2018  . Malnutrition of moderate degree 07/26/2018  . CAP (community acquired pneumonia) 07/25/2018  . Diarrhea in adult patient 06/14/2016  . Rhinitis 06/14/2016  . Essential hypertension 07/14/2015  . Hyperlipidemia 07/14/2015  . Protein calorie malnutrition (HCC) 07/14/2015  . Underweight due to inadequate caloric intake 07/14/2015  . Memory loss 07/14/2015  . Nail fungus 07/14/2015  . PAD (peripheral artery disease) (HCC) 07/14/2015  . Atherosclerosis 07/14/2015    Orientation RESPIRATION BLADDER Height & Weight     Self  Normal Incontinent Weight: 107 lb 8 oz (48.8 kg) Height:  5\' 4"  (162.6 cm)  BEHAVIORAL SYMPTOMS/MOOD NEUROLOGICAL BOWEL NUTRITION STATUS      Continent Diet(Regular diet)  AMBULATORY STATUS COMMUNICATION OF NEEDS Skin   Limited Assist Verbally PU Stage and Appropriate Care(Stage 2 Pressure ulcer)                       Personal Care Assistance Level of Assistance  Bathing, Feeding, Dressing Bathing Assistance: Limited assistance Feeding assistance: Limited assistance Dressing Assistance: Limited assistance      Functional Limitations Info  Sight, Hearing, Speech Sight Info: Adequate Hearing Info: Adequate Speech Info: Adequate    SPECIAL CARE FACTORS FREQUENCY  PT (By licensed PT), OT (By licensed OT)     PT Frequency: 5x a week OT Frequency: 5x a week            Contractures Contractures Info: Not present    Additional Factors Info  Code Status, Allergies Code Status Info: DNR Allergies Info: ACE INHIBITORS, TELMISARTAN            Current Medications (07/27/2018):  This is the current hospital active medication list Current Facility-Administered Medications  Medication Dose Route Frequency Provider Last Rate Last Dose  . bisacodyl (DULCOLAX) suppository 10 mg  10 mg Rectal Daily PRN Pyreddy, Vivien RotaPavan, MD      . brimonidine (ALPHAGAN) 0.2 % ophthalmic solution 1 drop  1 drop Both Eyes BID Salary, Montell D, MD   1 drop at 07/27/18 1038  . carvedilol (COREG) tablet 6.25 mg  6.25 mg Oral BID Salary, Montell D, MD   6.25 mg at 07/27/18 1035  . diclofenac sodium (VOLTAREN) 1 % transdermal gel 2 g  2 g Topical QID Salary, Montell D, MD   2 g at 07/27/18 1747  . docusate sodium (COLACE) capsule 100 mg  100 mg Oral BID Ihor AustinPyreddy, Pavan, MD   100 mg at 07/27/18 1036  . dorzolamide-timolol (COSOPT) 22.3-6.8 MG/ML ophthalmic solution 1 drop  1 drop Both Eyes BID Salary, Montell D, MD   1 drop at 07/27/18 1038  . feeding supplement (ENSURE ENLIVE) (ENSURE ENLIVE)  liquid 237 mL  237 mL Oral BID BM Pyreddy, Pavan, MD   237 mL at 07/27/18 1031  . furosemide (LASIX) tablet 20 mg  20 mg Oral Daily Salary, Montell D, MD   20 mg at 07/27/18 1035  . heparin injection 5,000 Units  5,000 Units Subcutaneous Q8H Cammy Copa, MD   5,000 Units at 07/27/18 1514  . levofloxacin (LEVAQUIN) IVPB 500 mg  500 mg Intravenous Q48H Ellington, Abby K, RPH      . metoprolol tartrate (LOPRESSOR) injection 2.5 mg  2.5 mg Intravenous Q6H PRN Salary, Montell D, MD   2.5 mg at 07/26/18 0328  . multivitamin-lutein  (OCUVITE-LUTEIN) capsule 1 capsule  1 capsule Oral Daily Pyreddy, Pavan, MD   1 capsule at 07/27/18 1037  . ondansetron (ZOFRAN) injection 4 mg  4 mg Intravenous Q6H PRN Salary, Montell D, MD      . polyethylene glycol (MIRALAX / GLYCOLAX) packet 17 g  17 g Oral Daily Pyreddy, Pavan, MD   17 g at 07/27/18 1032  . sodium phosphate (FLEET) 7-19 GM/118ML enema 1 enema  1 enema Rectal Daily PRN Pyreddy, Vivien Rota, MD      . traZODone (DESYREL) tablet 50 mg  50 mg Oral QHS Salary, Jetty Duhamel D, MD   50 mg at 07/26/18 2217     Discharge Medications: Please see discharge summary for a list of discharge medications.  Relevant Imaging Results:  Relevant Lab Results:   Additional Information SSN [id number]  Darleene Cleaver, [place]

## 2018-07-27 NOTE — Progress Notes (Addendum)
Pharmacy Antibiotic Note  Monica Vaughn is a 84 y.o. female admitted on 07/25/2018 with pneumonia. Patient received ceftriaxone and azithromycin and Pharmacy has been consulted for Levaquin dosing.   Plan: Start Levaquin 500 mg q48h based on CrCl 10-19 ml/min. Will start first dose today at 2200 7/31 QTc 469 WNL  Height: 5\' 4"  (162.6 cm) Weight: 107 lb 8 oz (48.8 kg) IBW/kg (Calculated) : 54.7  Temp (24hrs), Avg:97.8 F (36.6 C), Min:97.5 F (36.4 C), Max:98 F (36.7 C)  Recent Labs  Lab 07/25/18 1621 07/26/18 0443 07/27/18 0541  WBC 5.2  --  3.7  CREATININE 1.35* 1.11* 1.33*    Estimated Creatinine Clearance: 17.8 mL/min (A) (by C-G formula based on SCr of 1.33 mg/dL (H)).    Allergies  Allergen Reactions  . Ace Inhibitors   . Telmisartan     Antimicrobials this admission: Ceftriaxone 8/1 >> 8/1 Azithromycin 8/1 >> 8/1 Levaquin 8/2 >>   Microbiology results: 7/31 BCx: NGTD   Thank you for allowing pharmacy to be a part of this patient's care.  Pricilla RiffleAbby K Ellington, PharmD Pharmacy Resident  07/27/2018 3:11 PM

## 2018-07-27 NOTE — Progress Notes (Signed)
[facility] - [place] at [facility]   PATIENT NAME: Monica Vaughn    MR#:  [id number]  DATE OF BIRTH:  10-12-1917  SUBJECTIVE:  Patient seen and evaluated today Awake and responds to all verbal commands Decreased shortness of breath Has poor appetite and constipation  REVIEW OF SYSTEMS:    ROS Review of Systems  Constitutional: Negative.   HENT: Negative.   Eyes: Negative.   Respiratory: Negative.   Cardiovascular: Negative.   Gastrointestinal: Has constipation.   Genitourinary: Negative.   Musculoskeletal: Negative.   Skin: Negative.   Neurological: Negative.   Endo/Heme/Allergies: Negative.   Psychiatric/Behavioral: Negative.   All other systems reviewed and are negative.    DRUG ALLERGIES:   Allergies  Allergen Reactions  . Ace Inhibitors   . Telmisartan     VITALS:  Blood pressure 124/88, pulse 61, temperature 98 F (36.7 C), temperature source Axillary, resp. rate 14, height 5\' 4"  (1.626 m), weight 107 lb 8 oz (48.8 kg), SpO2 100 %.  PHYSICAL EXAMINATION:   Physical Exam  GENERAL:  84 y.o.-year-old patient lying in the bed with no acute distress.  EYES: Pupils equal, round, reactive to light and accommodation. No scleral icterus. Extraocular muscles intact.  HEENT: Head atraumatic, normocephalic. Oropharynx and nasopharynx clear.  NECK:  Supple, no jugular venous distention. No thyroid enlargement, no tenderness.  LUNGS: Decreased breath sounds bilaterally, bilateral rales heard. No use of accessory muscles of respiration.  CARDIOVASCULAR: S1, S2 normal. No murmurs, rubs, or gallops.  ABDOMEN: Soft, nontender, nondistended. Bowel sounds present. No organomegaly or mass.  EXTREMITIES: No cyanosis, clubbing or edema b/l.    NEUROLOGIC: Cranial nerves II through XII are intact. No focal Motor or sensory deficits b/l.   PSYCHIATRIC: The patient is alert and oriented x 2 SKIN: No obvious rash, lesion, or ulcer.   LABORATORY PANEL:    CBC Recent Labs  Lab 07/27/18 0541  WBC 3.7  HGB 15.2  HCT 47.5*  PLT 97*   ------------------------------------------------------------------------------------------------------------------ Chemistries  Recent Labs  Lab 07/25/18 1621  07/27/18 0541  NA 150*   < > 140  K 4.4   < > 3.8  CL 117*   < > 111  CO2 21*   < > 21*  GLUCOSE 109*   < > 126*  BUN 44*   < > 32*  CREATININE 1.35*   < > 1.33*  CALCIUM 9.7   < > 8.5*  AST 42*  --   --   ALT 39  --   --   ALKPHOS 65  --   --   BILITOT 1.4*  --   --    < > = values in this interval not displayed.   ------------------------------------------------------------------------------------------------------------------  Cardiac Enzymes Recent Labs  Lab 07/25/18 1957  TROPONINI 0.27*   ------------------------------------------------------------------------------------------------------------------  RADIOLOGY:  Dg Chest 2 View  Result Date: 07/25/2018 CLINICAL DATA:  Altered mental status, weakness EXAM: CHEST - 2 VIEW COMPARISON:  03/31/2013 FINDINGS: Mild patchy opacities in the medial right upper lobe and right lower lobe. Additional mild patchy retrocardiac opacity. Small bilateral pleural effusions, left greater than right. Cardiomegaly. Mild degenerative changes of the visualized thoracolumbar spine. IMPRESSION: Mild patchy right lung opacities, suspicious for pneumonia. Mild retrocardiac opacity, left lower lobe atelectasis versus pneumonia. Small bilateral pleural effusions, left greater than right. Electronically Signed   By: Charline BillsSriyesh  Krishnan M.D.   On: 07/25/2018 19:35   Ct Head Wo Contrast  Result Date: 07/25/2018 CLINICAL DATA:  Altered  mental status, dementia EXAM: CT HEAD WITHOUT CONTRAST TECHNIQUE: Contiguous axial images were obtained from the base of the skull through the vertex without intravenous contrast. COMPARISON:  03/31/2013 FINDINGS: Brain: No evidence of acute infarction, hemorrhage, extra-axial  collection or mass lesion/mass effect. Global cortical and central atrophy. Secondary ventricular prominence. Subcortical white matter and periventricular small vessel ischemic changes. Vascular: Mild intracranial atherosclerosis. Skull: Normal. Negative for fracture or focal lesion. Sinuses/Orbits: The visualized paranasal sinuses are essentially clear. The mastoid air cells are unopacified. Other: None. IMPRESSION: No evidence of acute intracranial abnormality. Atrophy with small vessel ischemic changes. Electronically Signed   By: Charline Bills M.D.   On: 07/25/2018 19:33     ASSESSMENT AND PLAN:   84 year old female patient with history of frontotemporal dementia, glaucoma, hypertension currently under hospitalist service for pneumonia  -Community-acquired pneumonia improving Discontinue IV Rocephin and Zithromax antibiotics Switch to oral abx  -Systemic inflammatory response syndrome secondary to pneumonia improved  -Acute kidney injury Responded to IV fluids  -Pleural effusion secondary to pneumonia On Lasix for diuresis  avoid fluid overload  -Elevated troponin secondary to demand ischemia  -Dementia  supportive care  -Constipation Stool softeners  - Patients daughter wants placement in [facility] facility Patient lives alone, patients son lives in [place] and patient daughter lives in [place] Unable to take care of the patient as they live in different places  -Child psychotherapist consultation  All the records are reviewed and case discussed with [facility] surgeon. Management plans discussed with the patient, family and they are in agreement.  CODE STATUS: DNR  DVT Prophylaxis: SCDs  TOTAL TIME TAKING CARE OF THIS PATIENT: 34 minutes.   POSSIBLE D/C IN 2 to 3 DAYS, DEPENDING ON CLINICAL CONDITION.  Ihor Austin M.D on 07/27/2018 at 11:48 AM  Between 7am to 6pm - Pager - [phone]  After 6pm go to [email or web address] - password [email or web address]  [facility]  Office  [phone]  CC: Primary care physician; System, Pcp Not In  Note: This dictation was prepared with Dragon dictation along with smaller phrase technology. Any transcriptional errors that result from this process are unintentional.

## 2018-07-27 NOTE — Plan of Care (Signed)
  Problem: Clinical Measurements: Goal: Will remain free from infection Outcome: Progressing Goal: Diagnostic test results will improve Outcome: Progressing   Problem: Nutrition: Goal: Adequate nutrition will be maintained Outcome: Progressing   Problem: Safety: Goal: Ability to remain free from injury will improve Outcome: Progressing   

## 2018-07-28 MED ORDER — LORAZEPAM 2 MG/ML IJ SOLN
1.0000 mg | INTRAMUSCULAR | Status: DC | PRN
  Administered 2018-07-29: 1 mg via INTRAVENOUS
  Filled 2018-07-28: qty 1

## 2018-07-28 MED ORDER — MORPHINE SULFATE (PF) 2 MG/ML IV SOLN
1.0000 mg | INTRAVENOUS | Status: DC | PRN
  Administered 2018-07-28 – 2018-07-29 (×5): 1 mg via INTRAVENOUS
  Filled 2018-07-28 (×5): qty 1

## 2018-07-28 MED ORDER — WITCH HAZEL-GLYCERIN EX PADS
MEDICATED_PAD | CUTANEOUS | Status: DC | PRN
  Administered 2018-07-28: 18:00:00 via TOPICAL
  Filled 2018-07-28 (×2): qty 100

## 2018-07-28 MED ORDER — WITCH HAZEL-GLYCERIN EX PADS: MEDICATED_PAD | CUTANEOUS | Status: DC | PRN

## 2018-07-28 NOTE — Progress Notes (Signed)
CCMD called to notify patient had 5 beats of vtach. Dr.Shah notified. Orders placed in for comfort measures and discontinuation of all  Monitors. Tele monitor off. No complaints at this time. Will continue to monitor patient. Support given to patient and family. Chaplain at bedside.

## 2018-07-28 NOTE — Progress Notes (Signed)
Family Meeting Note  Advance Directive:yes  Today a meeting took place with the Patient and Daughter at bedside).  Patient is unable to participate due ZO:XWRUEAto:Lacked capacity Dementia   The following clinical team members were present during this meeting:MD  The following were discussed:Patient's diagnosis:   [age] year old female patient with history of frontotemporal dementia, glaucoma, hypertension currently under hospitalist service for pneumonia  Past Medical History:  Diagnosis Date  . Cataract   . Frontotemporal dementia   . Glaucoma   . Hypertension   . Osteoarthrosis     -Community-acquired pneumonia  - Nonsustained v. Tech - asymptomatic -Systemic inflammatory response syndrome secondary to pneumonia -Acute kidney injury -Pleural effusion secondary to pneumonia -Elevated troponin secondary to demand ischemia -Dementia  -Constipation  Patient's progosis: < 2 weeks and Goals for treatment: DNR  Additional follow-up to be provided: Social work to evaluate for Hospice Home  Time spent during discussion:20 minutes  Delfino LovettVipul Taydon Nasworthy, MD

## 2018-07-28 NOTE — Progress Notes (Signed)
While making rounds, patient grimacing. PRN morphine given to patient. Environmental changes made. Support given to family at bedside.

## 2018-07-28 NOTE — Progress Notes (Signed)
[facility] - [place] at [facility]   PATIENT NAME: Monica Vaughn    MR#:  [id number]  DATE OF BIRTH:  Dec 20, 1917  SUBJECTIVE:  Sleepy.   REVIEW OF SYSTEMS:    ROS unable to obtain as she is lethargic - had 5 beats of V.tech - aymptomatic DRUG ALLERGIES:   Allergies  Allergen Reactions  . Ace Inhibitors   . Telmisartan     VITALS:  Blood pressure 106/78, pulse 62, temperature 98 F (36.7 C), temperature source Axillary, resp. rate 18, height 5\' 4"  (1.626 m), weight 48.8 kg (107 lb 8 oz), SpO2 95 %. PHYSICAL EXAMINATION:   Physical Exam  GENERAL:  84 y.o.-year-old patient lying in the bed with no acute distress.  EYES: Pupils equal, round, reactive to light and accommodation. No scleral icterus. Extraocular muscles intact.  HEENT: Head atraumatic, normocephalic. Oropharynx and nasopharynx clear.  NECK:  Supple, no jugular venous distention. No thyroid enlargement, no tenderness.  LUNGS: Decreased breath sounds bilaterally, bilateral rales heard. No use of accessory muscles of respiration.  CARDIOVASCULAR: S1, S2 normal. No murmurs, rubs, or gallops.  ABDOMEN: Soft, nontender, nondistended. Bowel sounds present. No organomegaly or mass.  EXTREMITIES: No cyanosis, clubbing or edema b/l.    NEUROLOGIC: Cranial nerves II through XII are intact. No focal Motor or sensory deficits b/l.   PSYCHIATRIC: The patient is sleepy SKIN: No obvious rash, lesion, or ulcer.  LABORATORY PANEL:   CBC Recent Labs  Lab 07/27/18 0541  WBC 3.7  HGB 15.2  HCT 47.5*  PLT 97*   ------------------------------------------------------------------------------------------------------------------ Chemistries  Recent Labs  Lab 07/25/18 1621  07/27/18 0541  NA 150*   < > 140  K 4.4   < > 3.8  CL 117*   < > 111  CO2 21*   < > 21*  GLUCOSE 109*   < > 126*  BUN 44*   < > 32*  CREATININE 1.35*   < > 1.33*  CALCIUM 9.7   < > 8.5*  AST 42*  --   --   ALT 39  --   --    ALKPHOS 65  --   --   BILITOT 1.4*  --   --    < > = values in this interval not displayed.   ------------------------------------------------------------------------------------------------------------------  Cardiac Enzymes Recent Labs  Lab 07/25/18 1957  TROPONINI 0.27*   ------------------------------------------------------------------------------------------------------------------  RADIOLOGY:  No results found.  ASSESSMENT AND PLAN:  84 year old female patient with history of frontotemporal dementia, glaucoma, hypertension currently under hospitalist service for pneumonia  -Community-acquired pneumonia  - Nonsustained v. Tech - asymptomatic -Systemic inflammatory response syndrome secondary to pneumonia -Acute kidney injury -Pleural effusion secondary to pneumonia -Elevated troponin secondary to demand ischemia -Dementia  -Constipation    Very poor prognosis   -Child psychotherapistocial worker consultation to evaluate for Hospice - I feels he would qualify for Hospice Home   All the records are reviewed and case discussed with Care Management/Social Worker. Management plans discussed with the patient, family (daughter at bedside) and they are in agreement.  CODE STATUS: DNR/Comfort care  DVT Prophylaxis: SCDs  TOTAL TIME TAKING CARE OF THIS PATIENT: 35 minutes.   POSSIBLE D/C IN 1-2 DAYS, DEPENDING ON CLINICAL CONDITION.  Delfino LovettVipul Alioune Hodgkin M.D on 07/28/2018 at 10:10 AM  Between 7am to 6pm - Pager - [phone]  After 6pm go to [email or web address] - password [email or web address] [facility]  [facility] [place] [facility]  Office  [phone]  CC: Primary care physician; System, Pcp Not In  Note: This dictation was prepared with Dragon dictation along with smaller phrase technology. Any transcriptional errors that result from this process are unintentional.

## 2018-07-28 NOTE — Progress Notes (Signed)
Chaplain returned to present Prayer shawl. The visitor of this pt help as witness. Chaplain prayered   07/28/18 1700  Clinical Encounter Type  Visited With Patient and family together  Visit Type Follow-up  Referral From Chaplain  Spiritual Encounters  Spiritual Needs Prayer   as well

## 2018-07-28 NOTE — Plan of Care (Signed)
  Problem: Health Behavior/Discharge Planning: °Goal: Ability to manage health-related needs will improve °Outcome: Not Progressing °  °Problem: Clinical Measurements: °Goal: Ability to maintain clinical measurements within normal limits will improve °Outcome: Not Progressing °  °Problem: Nutrition: °Goal: Adequate nutrition will be maintained °Outcome: Not Progressing °  °

## 2018-07-28 NOTE — Progress Notes (Signed)
Chaplain responded to an OR for EOL. Pt is alert and responsive. She is a bit confused at time but I forund her to be communicative. He daughter was at the bedside. Pt talked about a brother she remembered and could name her mother. Chaplain presented pastoral care and hope to the daughter as she is getting used to her mother leaving.    07/28/18 1100  Clinical Encounter Type  Visited With Patient and family together  Visit Type Initial;Spiritual support  Referral From Physician  Spiritual Encounters  Spiritual Needs Prayer

## 2018-07-28 NOTE — Clinical Social Work Note (Addendum)
CSW received consult for comfort care. CSW will send referral to [facility] and advise of bed availability once information available. CSW is following.   10:30 AM: Full referral sent including Facesheet, H+P, most recent progress note with life expectancy, and list of current comfort meds. CSW is waiting for call back from the hospice facility.  4:50PM: Hospice has received the referral and is reviewing for the patient to be included on the wait list. Currently, no beds are available.  Monica PonderKaren Martha Yeslin Vaughn, MSW, Theresia MajorsLCSWA [phone]

## 2018-07-29 MED ORDER — MENTHOL 3 MG MT LOZG
1.0000 | LOZENGE | OROMUCOSAL | Status: DC | PRN
  Filled 2018-07-29: qty 9

## 2018-07-29 NOTE — Progress Notes (Signed)
[facility] - [place] at [facility]   PATIENT NAME: Monica Vaughn    MR#:  [id number]  DATE OF BIRTH:  23-Oct-1917  SUBJECTIVE:  Alert and interactive.  REVIEW OF SYSTEMS:   ROS unable to obtain due to dementia DRUG ALLERGIES:   Allergies  Allergen Reactions  . Ace Inhibitors   . Telmisartan    VITALS:  Blood pressure 130/89, pulse 70, temperature 97.7 F (36.5 C), temperature source Oral, resp. rate 18, height 5\' 4"  (1.626 m), weight 48.8 kg (107 lb 8 oz), SpO2 100 %. PHYSICAL EXAMINATION:   Physical Exam  GENERAL:  84 y.o.-year-old patient lying in the bed with no acute distress.  EYES: Pupils equal, round, reactive to light and accommodation. No scleral icterus. Extraocular muscles intact.  HEENT: Head atraumatic, normocephalic. Oropharynx and nasopharynx clear.  NECK:  Supple, no jugular venous distention. No thyroid enlargement, no tenderness.  LUNGS: Decreased breath sounds bilaterally, bilateral rales heard. No use of accessory muscles of respiration.  CARDIOVASCULAR: S1, S2 normal. No murmurs, rubs, or gallops.  ABDOMEN: Soft, nontender, nondistended. Bowel sounds present. No organomegaly or mass.  EXTREMITIES: No cyanosis, clubbing or edema b/l.    NEUROLOGIC: Cranial nerves II through XII are intact. No focal Motor or sensory deficits b/l.   PSYCHIATRIC: The patient is pleasantly confused SKIN: No obvious rash, lesion, or ulcer.  LABORATORY PANEL:   CBC Recent Labs  Lab 07/27/18 0541  WBC 3.7  HGB 15.2  HCT 47.5*  PLT 97*   ------------------------------------------------------------------------------------------------------------------ Chemistries  Recent Labs  Lab 07/25/18 1621  07/27/18 0541  NA 150*   < > 140  K 4.4   < > 3.8  CL 117*   < > 111  CO2 21*   < > 21*  GLUCOSE 109*   < > 126*  BUN 44*   < > 32*  CREATININE 1.35*   < > 1.33*  CALCIUM 9.7   < > 8.5*  AST 42*  --   --   ALT 39  --   --   ALKPHOS 65  --   --    BILITOT 1.4*  --   --    < > = values in this interval not displayed.   ------------------------------------------------------------------------------------------------------------------  Cardiac Enzymes Recent Labs  Lab 07/25/18 1957  TROPONINI 0.27*   ------------------------------------------------------------------------------------------------------------------  RADIOLOGY:  No results found.  ASSESSMENT AND PLAN:  84 year old female patient with history of frontotemporal dementia, glaucoma, hypertension currently under hospitalist service for pneumonia  -Community-acquired pneumonia  - Nonsustained v. Tech - asymptomatic -Systemic inflammatory response syndrome secondary to pneumonia -Acute kidney injury -Pleural effusion secondary to pneumonia -Elevated troponin secondary to demand ischemia -Dementia  -Constipation    Overall poor prognosis   -Social worker consultation to evaluate for Hospice - I feels he would qualify for Hospice Home. No Hospice Home beds - on waiting list. If she doesn't qualify for Hospice Home - she may need to go Home with Hospice.   All the records are reviewed and case discussed with Care Management/Social Worker. Management plans discussed with the patient, family (daughter & son at bedside) and they are in agreement.  CODE STATUS: DNR/Comfort care  DVT Prophylaxis: SCDs  TOTAL TIME TAKING CARE OF THIS PATIENT: 35 minutes.   POSSIBLE D/C IN 1-2 DAYS, DEPENDING ON CLINICAL CONDITION.  Delfino LovettVipul Sherry Rogus M.D on 07/29/2018 at 1:35 PM  Between 7am to 6pm - Pager - [phone]  After 6pm go to [email or web address] - password [email or web address]  [facility] [place] [facility]  Office  [phone]  CC: Primary care physician; System, Pcp Not In  Note: This dictation was prepared with Dragon dictation along with smaller phrase technology. Any transcriptional errors that result from this process are unintentional.

## 2018-07-30 DIAGNOSIS — Z515 Encounter for palliative care: Secondary | ICD-10-CM

## 2018-07-30 DIAGNOSIS — R4182 Altered mental status, unspecified: Secondary | ICD-10-CM

## 2018-07-30 DIAGNOSIS — Z7189 Other specified counseling: Secondary | ICD-10-CM

## 2018-07-30 DIAGNOSIS — R638 Other symptoms and signs concerning food and fluid intake: Secondary | ICD-10-CM

## 2018-07-30 LAB — CULTURE, BLOOD (ROUTINE X 2)
Culture: NO GROWTH
Culture: NO GROWTH
Special Requests: ADEQUATE

## 2018-07-30 MED ORDER — LORAZEPAM 2 MG/ML IJ SOLN: 0.5000 mg | INTRAMUSCULAR | Status: DC | PRN

## 2018-07-30 MED ORDER — MORPHINE SULFATE (CONCENTRATE) 10 MG/0.5ML PO SOLN: 2.5000 mg | ORAL | Status: DC | PRN

## 2018-07-30 MED ORDER — DOCUSATE SODIUM 100 MG PO CAPS: 100.0000 mg | ORAL_CAPSULE | Freq: Two times a day (BID) | ORAL | 0 refills | Status: AC

## 2018-07-30 MED ORDER — MORPHINE SULFATE (PF) 2 MG/ML IV SOLN: 1.0000 mg | INTRAVENOUS | Status: DC | PRN

## 2018-07-30 MED ORDER — MORPHINE SULFATE (CONCENTRATE) 10 MG/0.5ML PO SOLN: 2.5000 mg | ORAL | 0 refills | Status: AC | PRN

## 2018-07-30 NOTE — Progress Notes (Signed)
Chaplain was rounding and visited pt for support. Chaplain offered emotional support with encouragement and prayer. Family is concerned about hospice home care ruling. Assessment made that Pt is eating well but family say she is not eating well. Chaplain will consult with Hospice coordinator to request a visit    07/30/18 1200  Clinical Encounter Type  Visited With Family;Patient and family together  Visit Type Follow-up;Spiritual support  Referral From Chaplain  Spiritual Encounters  Spiritual Needs Prayer;Emotional

## 2018-07-30 NOTE — Discharge Summary (Signed)
[facility] - [place] at [facility]   PATIENT NAME: Monica Vaughn    MR#:  [id number]  DATE OF BIRTH:  [birth year]  DATE OF ADMISSION:  07/25/2018   ADMITTING PHYSICIAN: Evelena Asa Salary, MD  DATE OF DISCHARGE: 07/30/18  PRIMARY CARE PHYSICIAN: System, Pcp Not In   ADMISSION DIAGNOSIS:  Weakness [R53.1] Elevated troponin [R74.8] Altered mental status, unspecified altered mental status type [R41.82] Community acquired pneumonia, unspecified laterality [J18.9] DISCHARGE DIAGNOSIS:  Active Problems:   CAP (community acquired pneumonia)   Pressure injury of skin   Malnutrition of moderate degree   Altered mental status   Poor fluid intake   Goals of care, counseling/discussion   Palliative care encounter  SECONDARY DIAGNOSIS:   Past Medical History:  Diagnosis Date  . Cataract   . Frontotemporal dementia   . Glaucoma   . Hypertension   . Osteoarthrosis    HOSPITAL COURSE:  Monica Vaughn is a 84 year old female who presented to the ED with altered mental status. In the ED, she was found to have CAP with SIRS. She was initially treated with Ceftriaxone and Azithromycin. She had very poor po intake throughout her hospitalization. Her family agreed that she had been having worsening po intake at home. After a long discussion, she was transitioned to full comfort care. She was taken off antibiotics and placed only on medications that provide comfort. Given her poor po intake, she was discharged to residential hospice.  DISCHARGE CONDITIONS:  Poor CONSULTS OBTAINED:  palliative  DRUG ALLERGIES:   Allergies  Allergen Reactions  . Ace Inhibitors   . Telmisartan    DISCHARGE MEDICATIONS:   Allergies as of 07/30/2018      Reactions   Ace Inhibitors    Telmisartan       Medication List    STOP taking these medications   brimonidine 0.2 % ophthalmic solution Commonly known as:  ALPHAGAN   carvedilol 3.125 MG tablet Commonly known as:  COREG     colchicine 0.6 MG tablet   diclofenac sodium 1 % Gel Commonly known as:  VOLTAREN   dorzolamide-timolol 22.3-6.8 MG/ML ophthalmic solution Commonly known as:  COSOPT   furosemide 20 MG tablet Commonly known as:  LASIX     TAKE these medications   docusate sodium 100 MG capsule Commonly known as:  COLACE Take 1 capsule (100 mg total) by mouth 2 (two) times daily.   morphine CONCENTRATE 10 MG/0.5ML Soln concentrated solution Take 0.13 mLs (2.6 mg total) by mouth every hour as needed for severe pain or shortness of breath.   PEG 3350 Powd Take 17 g by mouth daily as needed.        DISCHARGE INSTRUCTIONS:  1. Patient given oral morphine and ativan here for shortness of breath/anxiety. Please continue on discharge 2. All unnecessary medications were stopped DIET:  Regular diet DISCHARGE CONDITION:  poor ACTIVITY:  Bedrest OXYGEN:  Home Oxygen: No.  Oxygen Delivery: room air DISCHARGE LOCATION:  Residential hospice   If you experience worsening of your admission symptoms, develop shortness of breath, life threatening emergency, suicidal or homicidal thoughts you must seek medical attention immediately by calling 911 or calling your MD immediately  if symptoms less severe.  You Must read complete instructions/literature along with all the possible adverse reactions/side effects for all the Medicines you take and that have been prescribed to you. Take any new Medicines after you have completely understood and accpet all the possible adverse reactions/side effects.  Please note  You were cared for by a hospitalist during your hospital stay. If you have any questions about your discharge medications or the care you received while you were in the hospital after you are discharged, you can call the unit and asked to speak with the hospitalist on call if the hospitalist that took care of you is not available. Once you are discharged, your primary care physician will handle any  further medical issues. Please note that NO REFILLS for any discharge medications will be authorized once you are discharged, as it is imperative that you return to your primary care physician (or establish a relationship with a primary care physician if you do not have one) for your aftercare needs so that they can reassess your need for medications and monitor your lab values.    On the day of Discharge:  VITAL SIGNS:  Blood pressure (!) 169/108, pulse 76, temperature 97.6 F (36.4 C), temperature source Oral, resp. rate 16, height 5\' 4"  (1.626 m), weight 48.8 kg (107 lb 8 oz), SpO2 95 %. PHYSICAL EXAMINATION:  GENERAL:  84 y.o.-year-old patient lying in the bed, appears comfortable. EYES: Pupils equal, round, reactive to light and accommodation. No scleral icterus. Extraocular muscles intact.  HEENT: Head atraumatic, normocephalic. Moist mucous membranes. NECK:  Supple, no jugular venous distention. No thyroid enlargement, no tenderness.  LUNGS: Decreased breath sounds bilaterally, bilateral rales heard. No use of accessory muscles of respiration.  CARDIOVASCULAR: S1, S2 normal. No murmurs, rubs, or gallops.  ABDOMEN: Soft, nontender, nondistended. Bowel sounds present. No organomegaly or mass.  EXTREMITIES: No cyanosis, clubbing or edema b/l.    NEUROLOGIC: Cranial nerves II through XII are intact. No focal motor or sensory deficits b/l.   PSYCHIATRIC: The patient is pleasantly confused, occasionally answers questions. SKIN: No obvious rash, lesion, or ulcer.   DATA REVIEW:   CBC Recent Labs  Lab 07/27/18 0541  WBC 3.7  HGB 15.2  HCT 47.5*  PLT 97*    Chemistries  Recent Labs  Lab 07/25/18 1621  07/27/18 0541  NA 150*   < > 140  K 4.4   < > 3.8  CL 117*   < > 111  CO2 21*   < > 21*  GLUCOSE 109*   < > 126*  BUN 44*   < > 32*  CREATININE 1.35*   < > 1.33*  CALCIUM 9.7   < > 8.5*  AST 42*  --   --   ALT 39  --   --   ALKPHOS 65  --   --   BILITOT 1.4*  --   --    <  > = values in this interval not displayed.     Microbiology Results  Results for orders placed or performed during the hospital encounter of 07/25/18  Blood culture (routine x 2)     Status: None   Collection Time: 07/25/18  7:57 PM  Result Value Ref Range Status   Specimen Description BLOOD LEFT ANTECUBITAL  Final   Special Requests   Final    BOTTLES DRAWN AEROBIC AND ANAEROBIC Blood Culture adequate volume   Culture   Final    NO GROWTH 5 DAYS Performed at [facility], [address], [place], [place] [address]    Report Status 07/30/2018 FINAL  Final  Blood culture (routine x 2)     Status: None   Collection Time: 07/25/18  8:13 PM  Result Value Ref Range Status  Specimen Description BLOOD BLOOD LEFT ARM  Final   Special Requests   Final    BOTTLES DRAWN AEROBIC AND ANAEROBIC Blood Culture results may not be optimal due to an inadequate volume of blood received in culture bottles   Culture   Final    NO GROWTH 5 DAYS Performed at [facility], [address] [place], [place] [address]    Report Status 07/30/2018 FINAL  Final    RADIOLOGY:  No results found.   Management plans discussed with the patient, family and they are in agreement.  CODE STATUS: DNR   TOTAL TIME TAKING CARE OF THIS PATIENT: 35 minutes.    Jinny BlossomKaty D Jenean Escandon M.D on 07/30/2018 at 2:50 PM  Between 7am to 6pm - Pager [phone]- [phone]  After 6pm go to [email or web address] - Social research officer, governmentpassword [email or web address]  [facility] [place] [facility]  Office  [phone]  CC: Primary care physician; System, Pcp Not In   Note: This dictation was prepared with Dragon dictation along with smaller phrase technology. Any transcriptional errors that result from this process are unintentional.

## 2018-07-30 NOTE — Care Management Important Message (Signed)
Copy of signed IM left with patient in room.  

## 2018-07-30 NOTE — Progress Notes (Addendum)
[facility] - [place] at [facility]   PATIENT NAME: Monica Vaughn    MR#:  [id number]  DATE OF BIRTH:  09/20/1917  SUBJECTIVE:  Alert and answering questions this morning. No complaints. No chest pain or shortness of breath. REVIEW OF SYSTEMS:   ROS- unable to obtain due to dementia and sleepiness. DRUG ALLERGIES:   Allergies  Allergen Reactions  . Ace Inhibitors   . Telmisartan    VITALS:  Blood pressure (!) 169/108, pulse 76, temperature 97.6 F (36.4 C), temperature source Oral, resp. rate 16, height 5\' 4"  (1.626 m), weight 48.8 kg (107 lb 8 oz), SpO2 95 %. PHYSICAL EXAMINATION:   Physical Exam  GENERAL:  84 y.o.-year-old patient lying in the bed, appears comfortable. EYES: Pupils equal, round, reactive to light and accommodation. No scleral icterus. Extraocular muscles intact.  HEENT: Head atraumatic, normocephalic. Moist mucous membranes. NECK:  Supple, no jugular venous distention. No thyroid enlargement, no tenderness.  LUNGS: Decreased breath sounds bilaterally, bilateral rales heard. No use of accessory muscles of respiration.  CARDIOVASCULAR: S1, S2 normal. No murmurs, rubs, or gallops.  ABDOMEN: Soft, nontender, nondistended. Bowel sounds present. No organomegaly or mass.  EXTREMITIES: No cyanosis, clubbing or edema b/l.    NEUROLOGIC: Cranial nerves II through XII are intact. No focal motor or sensory deficits b/l.   PSYCHIATRIC: The patient is pleasantly confused, occasionally answers questions. SKIN: No obvious rash, lesion, or ulcer.  LABORATORY PANEL:   CBC Recent Labs  Lab 07/27/18 0541  WBC 3.7  HGB 15.2  HCT 47.5*  PLT 97*   ------------------------------------------------------------------------------------------------------------------ Chemistries  Recent Labs  Lab 07/25/18 1621  07/27/18 0541  NA 150*   < > 140  K 4.4   < > 3.8  CL 117*   < > 111  CO2 21*   < > 21*  GLUCOSE 109*   < > 126*  BUN 44*   < > 32*   CREATININE 1.35*   < > 1.33*  CALCIUM 9.7   < > 8.5*  AST 42*  --   --   ALT 39  --   --   ALKPHOS 65  --   --   BILITOT 1.4*  --   --    < > = values in this interval not displayed.   ------------------------------------------------------------------------------------------------------------------  Cardiac Enzymes Recent Labs  Lab 07/25/18 1957  TROPONINI 0.27*   ------------------------------------------------------------------------------------------------------------------  RADIOLOGY:  No results found.  ASSESSMENT AND PLAN:  84 year old female patient with history of frontotemporal dementia, glaucoma, hypertension currently under hospitalist service for pneumonia  Community-acquired pneumonia/pleural effusion- stable, now off antibiotics. On room air. - seen by palliative care today, who feels patient is a candidate for residential hospice placement, awaiting placement.  Nonsustained V tach - asymptomatic - cardiac monitoring  Acute kidney injury- likely due to poor po intake - no longer obtaining labs, as patient is full comfort care  Elevated troponin secondary to demand ischemia- not having any chest pain - no work-up as patient is full comfort care  Dementia- stable   Constipation - Miralax daily - FLEET enema prn  All the records are reviewed and case discussed with Care Management/Social Worker. Management plans discussed with the patient, family (daughter & son at bedside) and they are in agreement.  CODE STATUS: DNR/Comfort care  DVT Prophylaxis: SCDs  TOTAL TIME TAKING CARE OF THIS PATIENT: 30 minutes.   POSSIBLE D/C IN 1-2 DAYS, DEPENDING ON CLINICAL CONDITION.  Jinny Blossom Teylor Wolven M.D on  07/30/2018 at 11:38 AM  Between 7am to 6pm - Pager - [phone]  After 6pm go to [email or web address] - password [email or web address]  [facility] [place] [facility]  Office  [phone]  CC: Primary care physician; System, Pcp Not In  Note: This dictation was prepared with  Dragon dictation along with smaller phrase technology. Any transcriptional errors that result from this process are unintentional.

## 2018-07-30 NOTE — Discharge Instructions (Signed)
Ms. Jarold Mottoatterson has been taken off all her previous medications and has been transitioned to full comfort care. She will be discharged to the hospice home.

## 2018-07-30 NOTE — Consult Note (Signed)
Consultation Note Date: 07/30/2018   Patient Name: Monica Vaughn  DOB: September 09, 1917  MRN: [id number]  Age / Sex: 84 y.o., female  PCP: System, Pcp Not In Referring Physician: Sela Hua, MD  Reason for Consultation: Hospice Evaluation and Terminal Care  HPI/Patient Profile: 84 y.o. female  with past medical history of frontotemporal dementia, cataract, glaucoma, HTN, osteoarthritis admitted on 07/25/2018 with 2-3 days of worsening mental state, less active, emotional outbursts, yelling and found to have pneumonia on CXR and admitted with SIRS. She was being treated for pneumonia for which she did not complete treatment although lungs sound clear to me today.   Clinical Assessment and Goals of Care: I met today at Monica Vaughn's bedside with her 3 children. They share with me that she required assistance prior to hospitalization and was frail but able to walk with walker and sometimes a cane with standby assist and was eating. They are very concerned as now she is bed-bound, swelling in bilat feet and legs above ankle, moans and yells out with any tactile stimulation, and they have been trying to force her to eat some but only able to get her to accept 1-2 spoonfuls at most. She also refuses Ensure. Urine output is dwindling and appears dark and concentrated (no labs since 8/2). They say that nursing has has to crush and place pills in applesauce and even then struggle to get her to accept and take medicine. Noted that she has not taken any pills since yesterday. She is very sleepy today and nods off during my visit. She has been requiring morphine and ativan prn to ensure comfort. Family concerned as they feel she is declining further each day.   I discussed more with family hospice facility used for patients when we believe that they have prognosis of 2 weeks or less and they certainly feel that they are at  that stage. Based on history this very well may be true. I also educated that if she were to go to hospice and perk up and begin to improve at all then they will need to work with hospice staff for another plan. Family would like to continue to pursue hospice facility and I believe this would be a reasonable option given Monica Vaughn's history and her current condition/exam today.   Primary Decision Maker NEXT OF KIN 3 children today at bedside    SUMMARY OF RECOMMENDATIONS   - Desire is for comfort focused care - Hopeful for transition to hospice facility  Code Status/Advance Care Planning:  DNR   Symptom Management:   Agitation: Ativan 0.5-1 mg every 2 hours prn. May also be utilized for sleep (difficulty with po meds per family). May have trazodone 50 mg qhs for sleep if able to take.   Pain/SOB: Morphine 1 mg IV OR Morphine SL 2.5 mg every hours prn.   Palliative Prophylaxis:   Aspiration, Bowel Regimen, Delirium Protocol, Frequent Pain Assessment, Oral Care and Turn Reposition  Additional Recommendations (Limitations, Scope, Preferences):  Full Comfort Care  Psycho-social/Spiritual:  Desire for further Chaplaincy support:no  Additional Recommendations: Caregiving  Support/Resources, Education on Hospice and Grief/Bereavement Support  Prognosis:   < 2 weeks very likely given continued functional decline and concern for declining renal function given decreased urine output and poor intake/dehydration  Discharge Planning: Hospice facility      Primary Diagnoses: Present on Admission: . CAP (community acquired pneumonia)   I have reviewed the medical record, interviewed the patient and family, and examined the patient. The following aspects are pertinent.  Past Medical History:  Diagnosis Date  . Cataract   . Frontotemporal dementia   . Glaucoma   . Hypertension   . Osteoarthrosis    Social History   Socioeconomic History  . Marital status: Married     Spouse name: Not on file  . Number of children: Not on file  . Years of education: Not on file  . Highest education level: Not on file  Occupational History  . Not on file  Social Needs  . Financial resource strain: Not on file  . Food insecurity:    Worry: Not on file    Inability: Not on file  . Transportation needs:    Medical: Not on file    Non-medical: Not on file  Tobacco Use  . Smoking status: Never Smoker  . Smokeless tobacco: Never Used  Substance and Sexual Activity  . Alcohol use: No    Alcohol/week: 0.0 oz  . Drug use: No  . Sexual activity: Not on file  Lifestyle  . Physical activity:    Days per week: Not on file    Minutes per session: Not on file  . Stress: Not on file  Relationships  . Social connections:    Talks on phone: Not on file    Gets together: Not on file    Attends religious service: Not on file    Active member of club or organization: Not on file    Attends meetings of clubs or organizations: Not on file    Relationship status: Not on file  Other Topics Concern  . Not on file  Social History Narrative  . Not on file   History reviewed. No pertinent family history. Scheduled Meds: . docusate sodium  100 mg Oral BID  . polyethylene glycol  17 g Oral Daily  . traZODone  50 mg Oral QHS   Continuous Infusions: PRN Meds:.LORazepam, menthol-cetylpyridinium, morphine injection, ondansetron (ZOFRAN) IV, sodium phosphate, witch hazel-glycerin Allergies  Allergen Reactions  . Ace Inhibitors   . Telmisartan    Review of Systems  Unable to perform ROS: Dementia    Physical Exam  Constitutional: She appears well-developed. She appears lethargic.  HENT:  Head: Normocephalic and atraumatic.  Cardiovascular: Normal rate.  Pulmonary/Chest: Effort normal and breath sounds normal. No accessory muscle usage. No tachypnea. No respiratory distress.  Abdominal: Soft. Normal appearance.  Neurological: She appears lethargic. She is disoriented.    Nursing note and vitals reviewed.   Vital Signs: BP (!) 169/108 (BP Location: Right Arm)   Pulse 76   Temp 97.6 F (36.4 C) (Oral)   Resp 16   Ht 5' 4"  (1.626 m)   Wt 48.8 kg (107 lb 8 oz)   SpO2 95%   BMI 18.45 kg/m  Pain Scale: PAINAD   Pain Score: Asleep   SpO2: SpO2: 95 % O2 Device:SpO2: 95 % O2 Flow Rate: .   IO: Intake/output summary:   Intake/Output Summary (Last 24 hours) at 07/30/2018 1324 Last data filed at  07/30/2018 0400 Gross per 24 hour  Intake -  Output 300 ml  Net -300 ml    LBM: Last BM Date: 07/24/18 Baseline Weight: Weight: 44.5 kg (98 lb) Most recent weight: Weight: 48.8 kg (107 lb 8 oz)     Palliative Assessment/Data: 30%     Time Total: 50 min  Greater than 50%  of this time was spent counseling and coordinating care related to the above assessment and plan.  Signed by: Vinie Sill, NP Palliative Medicine Team Pager # [phone] (M-F 8a-5p) Team Phone # [phone] (Nights/Weekends)

## 2018-07-30 NOTE — Progress Notes (Signed)
This RN went to check on patient.  Pt's daughter at bedside.This RN informed the daughter that since the patient was asleep, the nightly trazodone dose was held.  Daughter agreeable with not waking her up to give her a sleeping pill.  Patient resting with NAD noted; occasionally pushing off covers.  Daughter agreed to call if they need anything.

## 2018-07-30 NOTE — Clinical Social Work Note (Addendum)
Patient to be d/c'ed today to [facility].  Patient and family agreeable to plans will transport via ems hospice nurse liaison to call report.  Patient's family at bedside and are aware of patient's discharge.  Windell MouldingEric Deavin Forst, MSW, Theresia MajorsLCSWA [phone]

## 2018-07-30 NOTE — Progress Notes (Signed)
New hospice home referral received over the weekend. Patient is a 84 year old woman with past medical history of frontotemporal dementia, cataract, glaucoma, HTN, osteoarthritis,  admitted to [facility]  on 07/25/2018 with 2-3 days of worsening mental state, less active, emotional outbursts, yelling and found to have pneumonia on CXR and admitted with SIRS. She was treated with antibiotics, but has continued to decline, with poor oral intake and agitation. Palliative Medicine met with patient;'s family and evaluated patient. Family continues to want comfort focused care with transfer to the hospice home. Writer met in the room with patient's daughter Peter Congo and 2 other children to initiate education regarding hospice services, philosophy and team approach to care with good understanding voiced,  Questions answered, consents signed, Patient slept through out the meeting and did not participate. Additional information faxed to referral. Report called to the hospice home. EMS notified for a 6 pm pick up. Family and hospital care team updated. Signed DNR in place in discharge packet.  Thank you for the opportunity to be involved in the care of this patient and her family. Flo Shanks RN, BSN, [facility] [place], hospital liaison [phone]

## 2018-08-26 DEATH — deceased

## 2019-10-07 IMAGING — CR DG CHEST 2V
1 series · 3 of 3 positions shown · non-contrast
Comparison: 03/31/2013

CLINICAL DATA: Altered mental status, weakness

EXAM:
CHEST - 2 VIEW

[Series 1: w chest lat · 0.14mm/px · 3 of 3 slices shown]
[im 1/3]
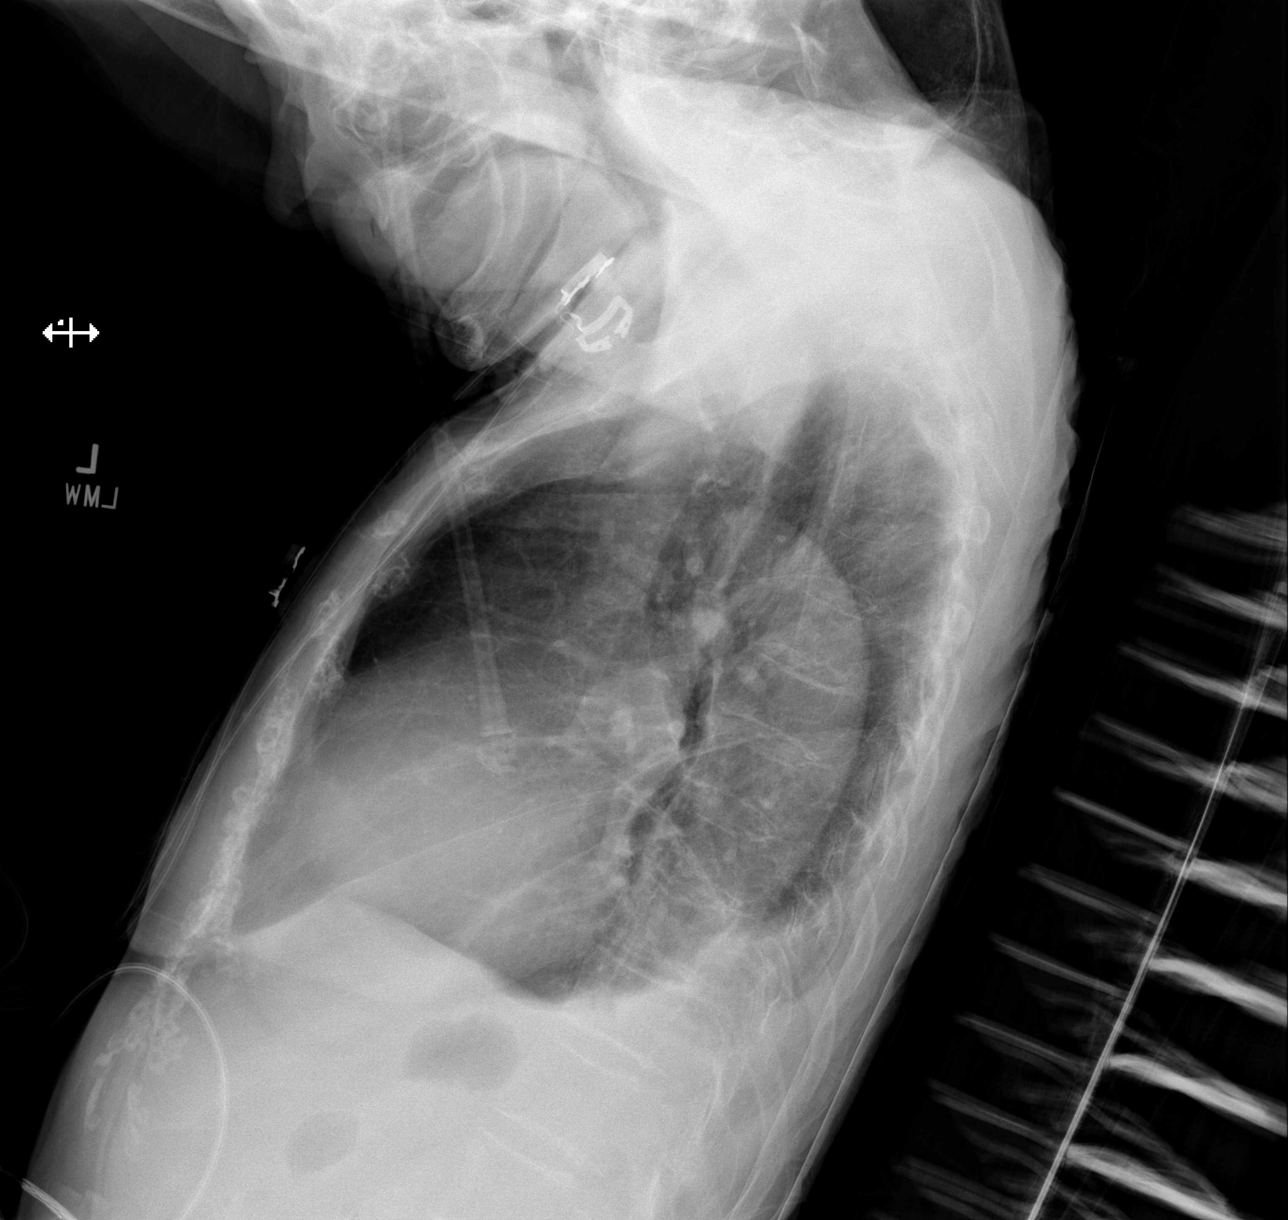
[im 2/3]
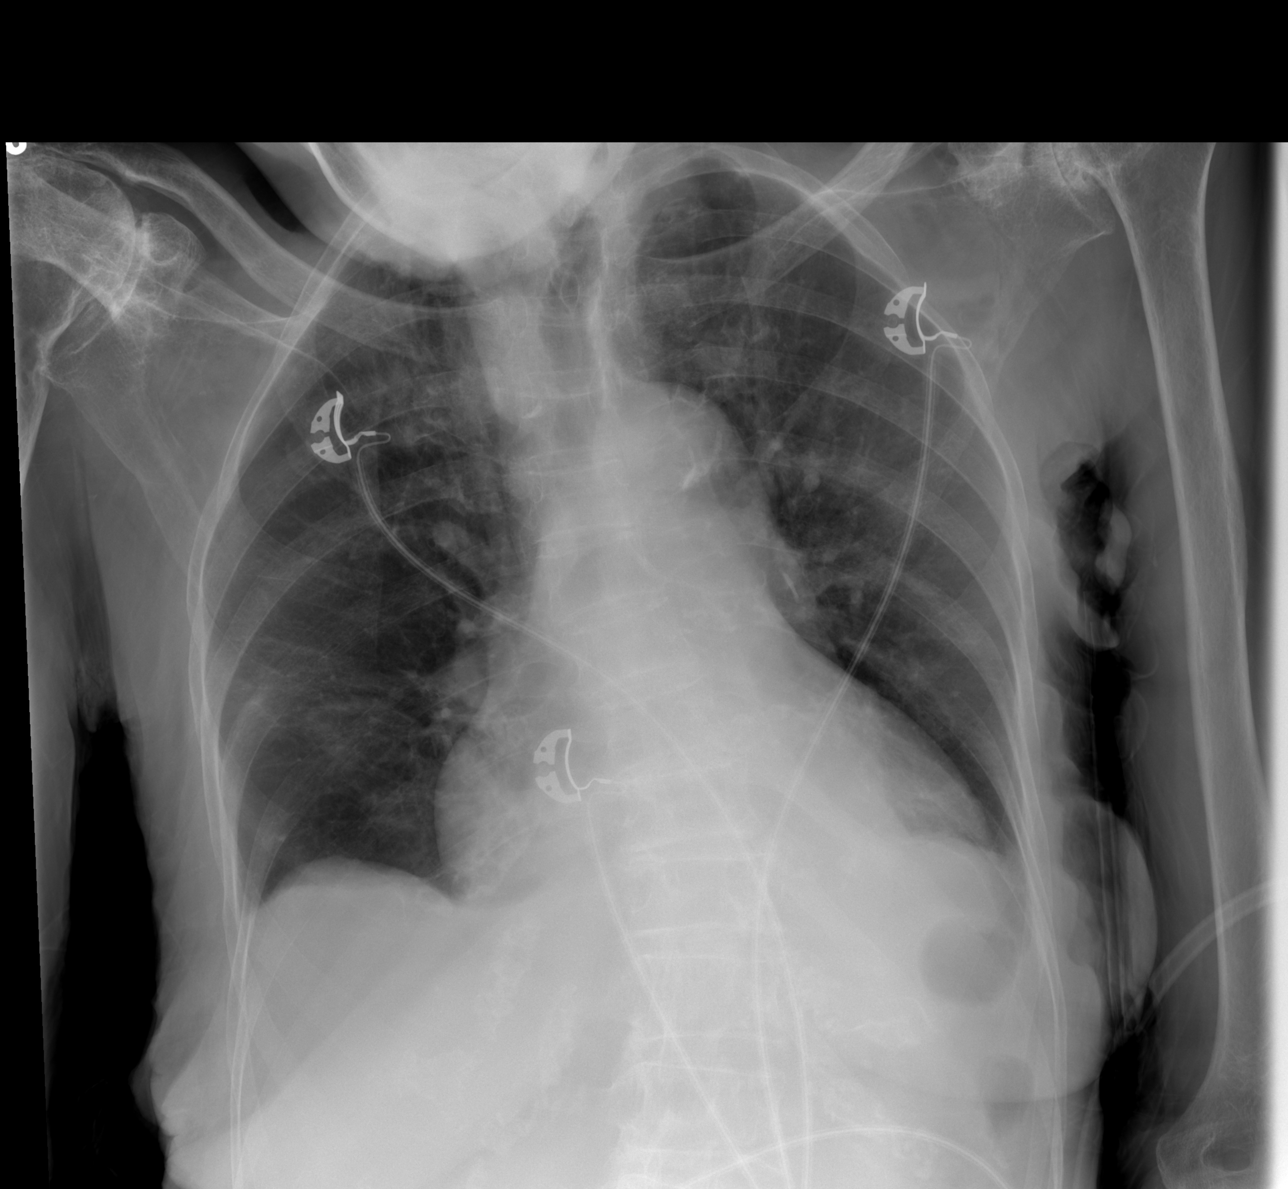
[im 3/3]
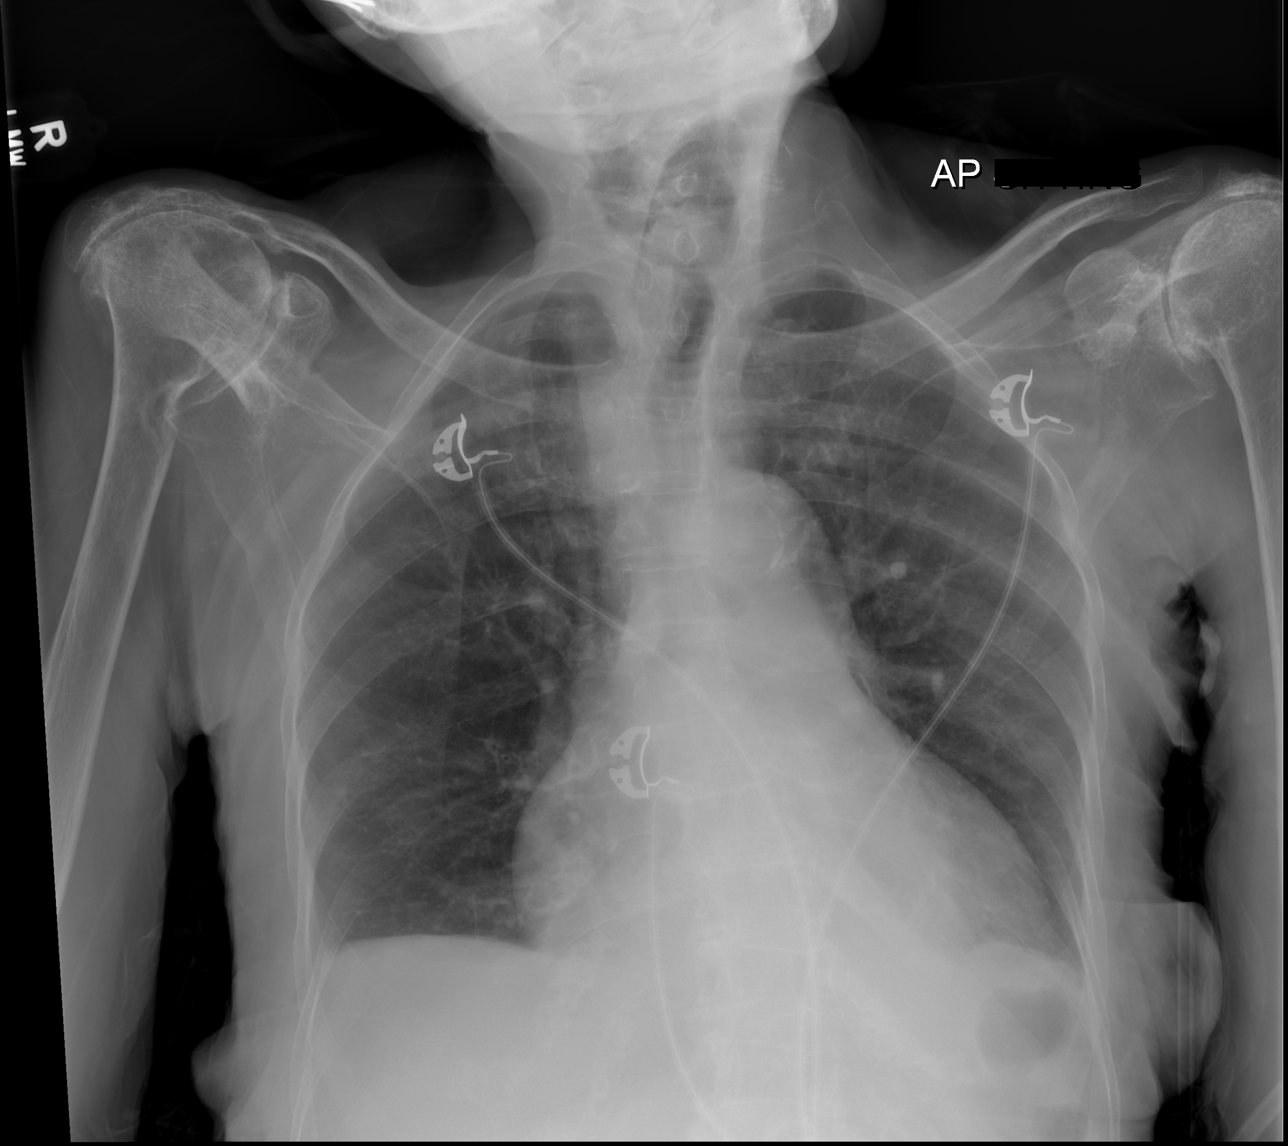

[3 of 3 positions shown; findings below may reference images not displayed]

FINDINGS: Mild patchy opacities in the medial right upper lobe and right lower
lobe.

Additional mild patchy retrocardiac opacity. Small bilateral pleural
effusions, left greater than right.

Cardiomegaly.

Mild degenerative changes of the visualized thoracolumbar spine.
IMPRESSION: Mild patchy right lung opacities, suspicious for pneumonia.

Mild retrocardiac opacity, left lower lobe atelectasis versus
pneumonia.

Small bilateral pleural effusions, left greater than right.

## 2019-10-07 IMAGING — CT CT HEAD W/O CM
4 series · 16 of 47 positions shown, 18 images · non-contrast
Comparison: 03/31/2013

CLINICAL DATA: Altered mental status, dementia

EXAM:
CT HEAD WITHOUT CONTRAST
TECHNIQUE: Contiguous axial images were obtained from the base of the skull
through the vertex without intravenous contrast.

[Series 2: head wo · axial · 0.42mm/px · z∈[+169,+274]mm · 7 of 29 slices shown, 9 images]
[im 4/29  brain]
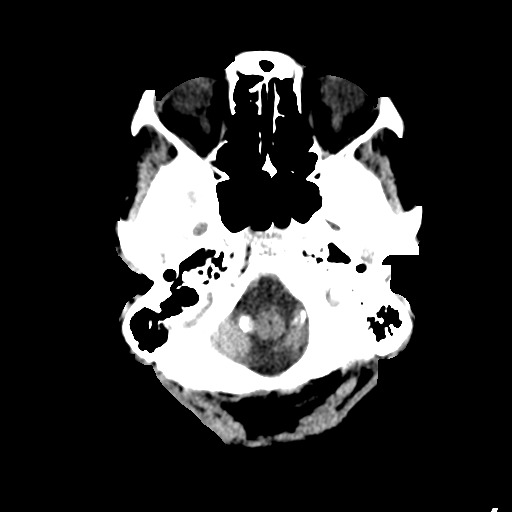
[im 4/29  bone]
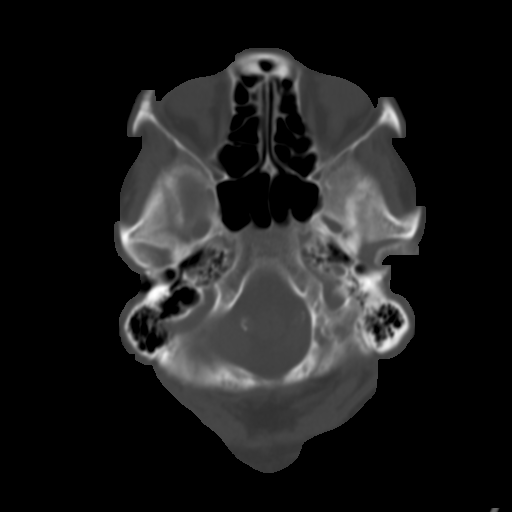
[im 8/29  brain]
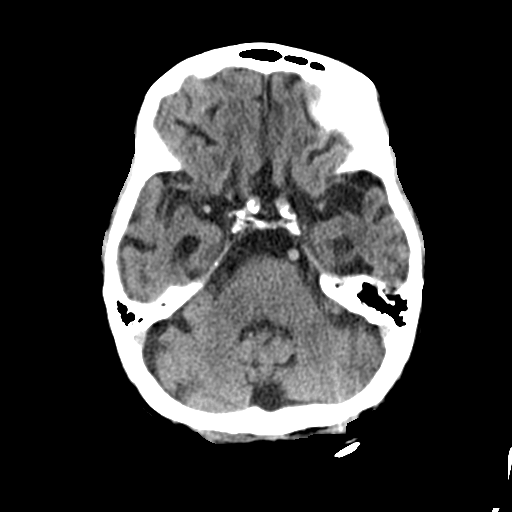
[im 11/29  brain]
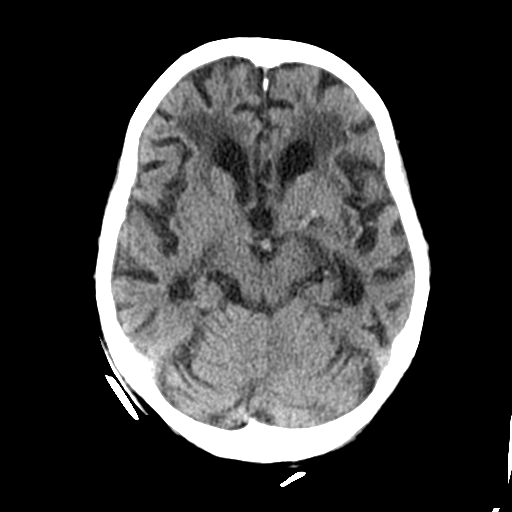
[im 15/29  brain]
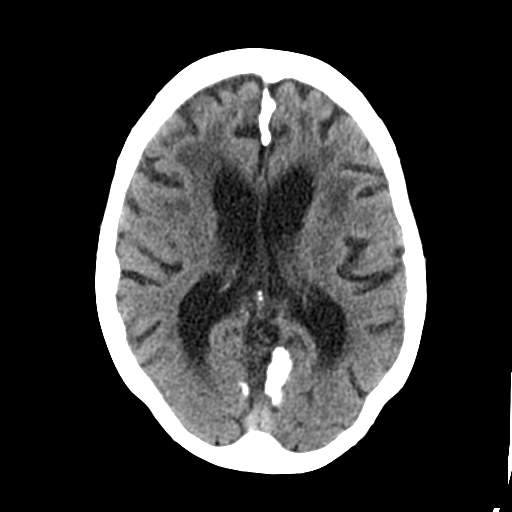
[im 18/29  brain]
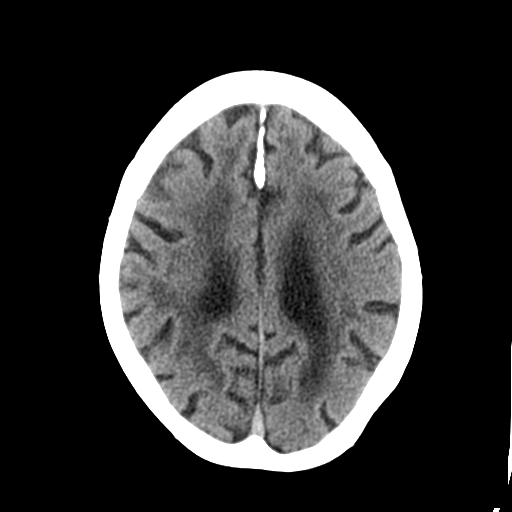
[im 18/29  bone]
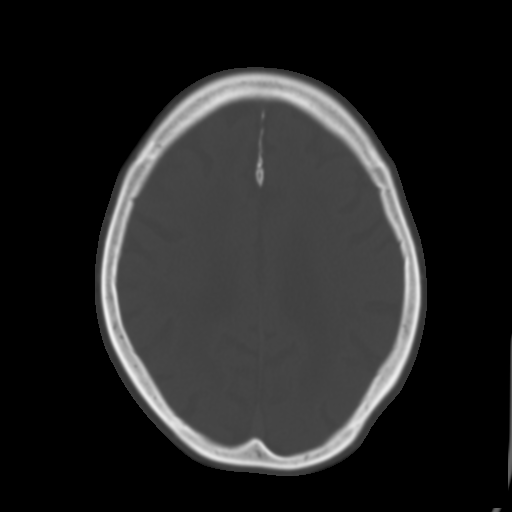
[im 22/29  brain]
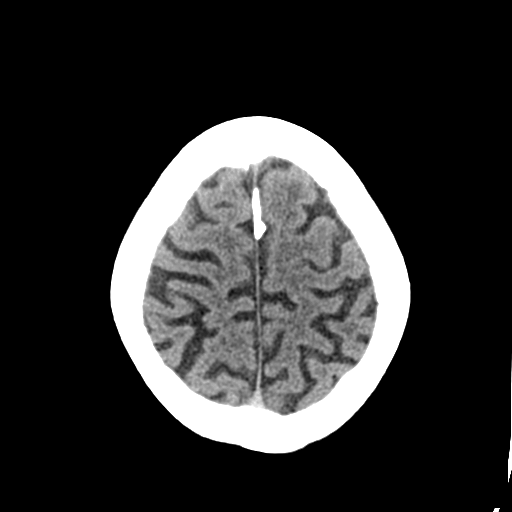
[im 25/29  brain]
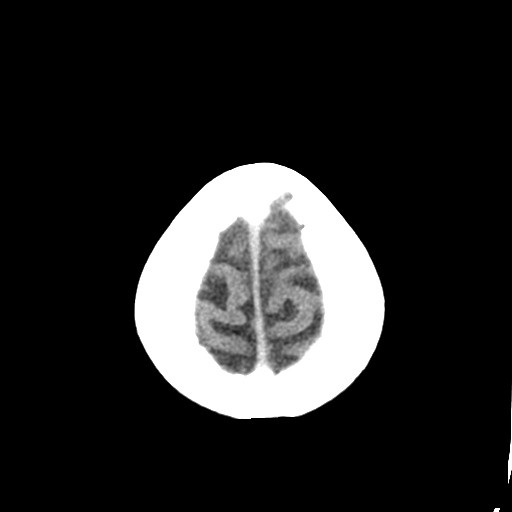

[Series 3: head bone · axial · 0.42mm/px · z∈[+168,+196]mm · 3 of 73 slices shown]
[im 8/73  bone]
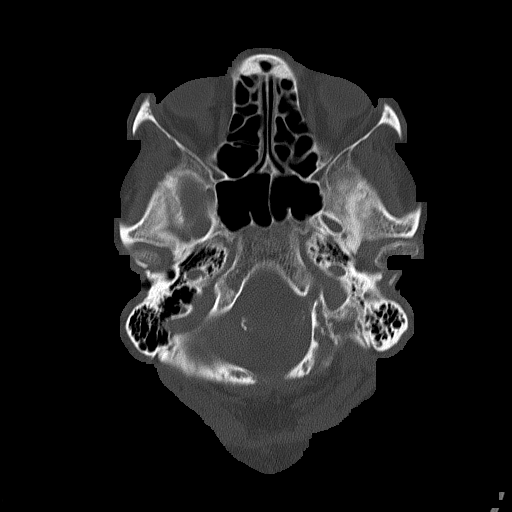
[im 15/73  bone]
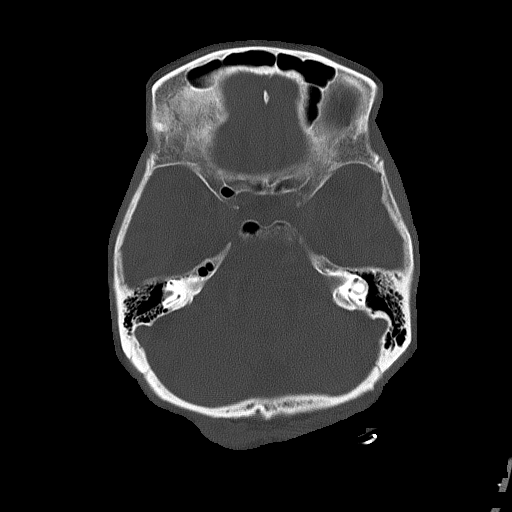
[im 22/73  bone]
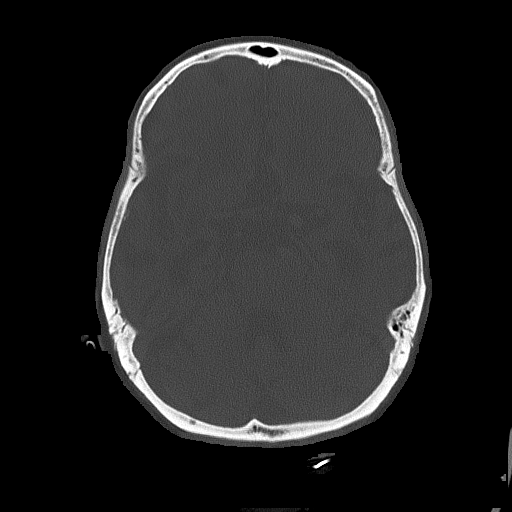

[Series 4: coronal soft tissue · coronal · 0.31mm/px · 3 of 62 slices shown]
[im 21/62  brain]
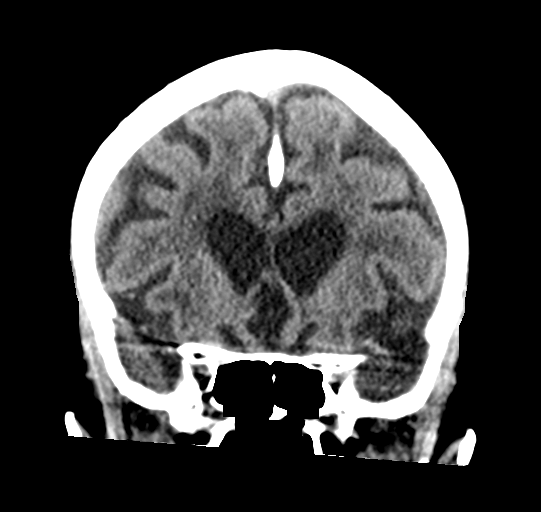
[im 28/62  brain]
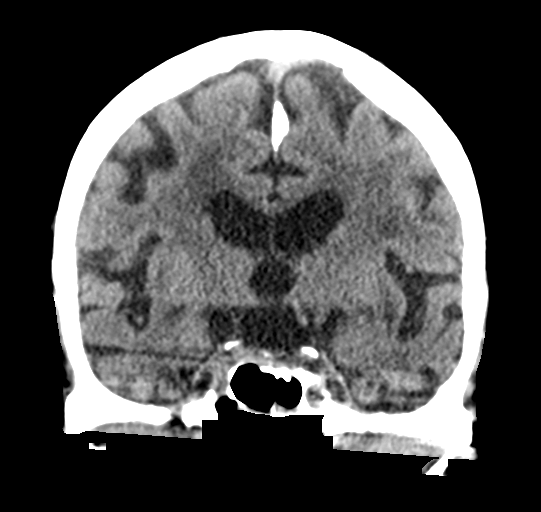
[im 34/62  brain]
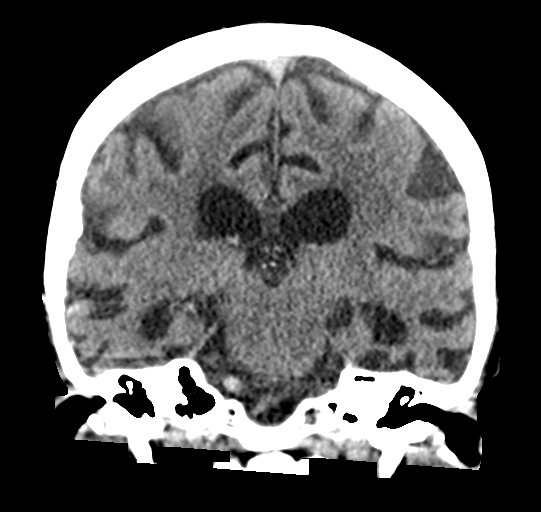

[Series 5: sagittal soft tissue · sagittal · 0.31mm/px · 3 of 50 slices shown]
[im 17/50  brain]
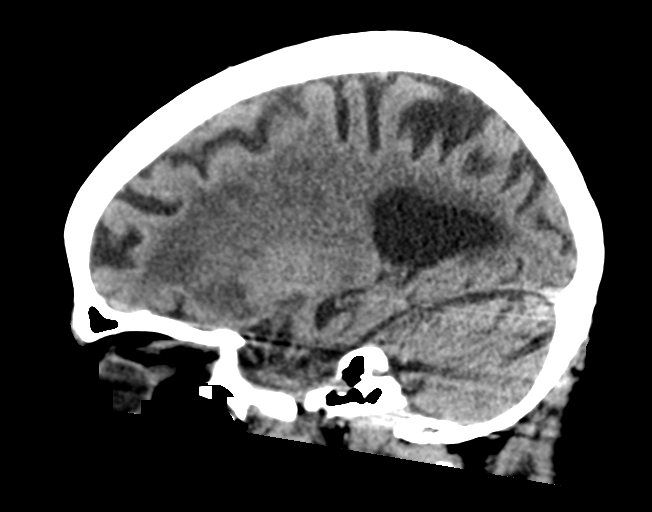
[im 25/50  brain]
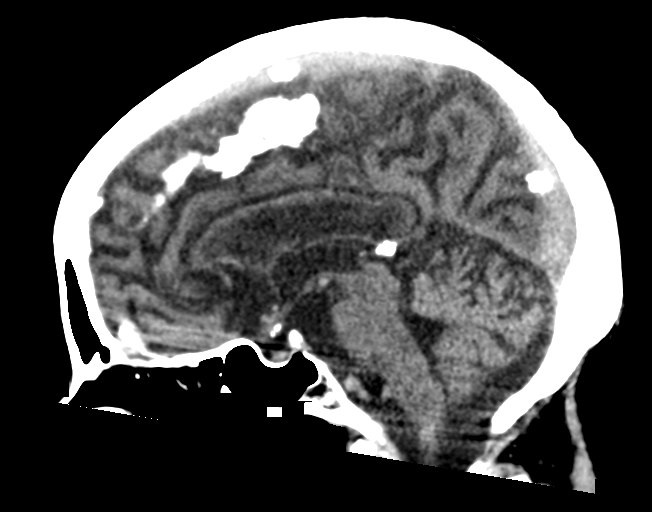
[im 33/50  brain]
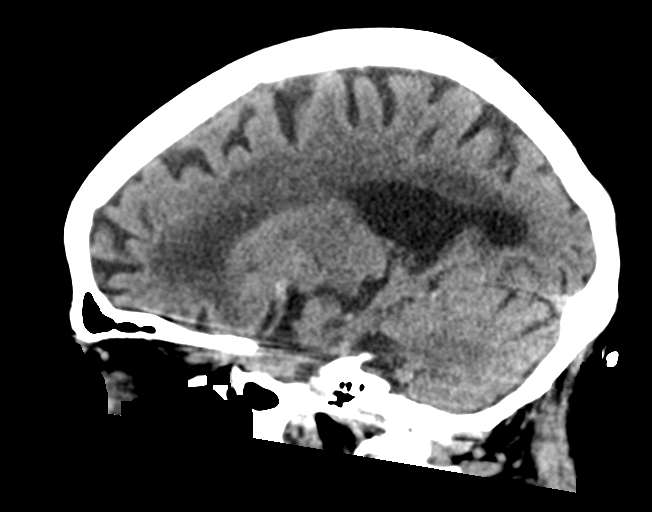

[16 of 47 positions shown; findings below may reference images not displayed]

FINDINGS: Brain: No evidence of acute infarction, hemorrhage, extra-axial
collection or mass lesion/mass effect.

Global cortical and central atrophy. Secondary ventricular
prominence.

Subcortical white matter and periventricular small vessel ischemic
changes.

Vascular: Mild intracranial atherosclerosis.

Skull: Normal. Negative for fracture or focal lesion.

Sinuses/Orbits: The visualized paranasal sinuses are essentially
clear. The mastoid air cells are unopacified.

Other: None.
IMPRESSION: No evidence of acute intracranial abnormality.

Atrophy with small vessel ischemic changes.
